# Patient Record
Sex: Male | Born: 1950 | Race: White | Hispanic: No | Marital: Single | Smoking: Current every day smoker
Health system: Southern US, Community
[De-identification: ages and names within clinical notes are randomized; demographics above are authoritative.]

## PROBLEM LIST (undated history)

## (undated) DIAGNOSIS — F319 Bipolar disorder, unspecified: Secondary | ICD-10-CM

## (undated) DIAGNOSIS — M810 Age-related osteoporosis without current pathological fracture: Secondary | ICD-10-CM

## (undated) DIAGNOSIS — E785 Hyperlipidemia, unspecified: Secondary | ICD-10-CM

## (undated) DIAGNOSIS — E871 Hypo-osmolality and hyponatremia: Secondary | ICD-10-CM

## (undated) DIAGNOSIS — F259 Schizoaffective disorder, unspecified: Secondary | ICD-10-CM

## (undated) DIAGNOSIS — J449 Chronic obstructive pulmonary disease, unspecified: Secondary | ICD-10-CM

## (undated) DIAGNOSIS — F79 Unspecified intellectual disabilities: Secondary | ICD-10-CM

---

## 2008-12-01 ENCOUNTER — Ambulatory Visit: Payer: Self-pay | Admitting: Family Medicine

## 2011-02-05 ENCOUNTER — Emergency Department: Payer: Self-pay | Admitting: Emergency Medicine

## 2011-08-05 ENCOUNTER — Emergency Department: Payer: Self-pay | Admitting: *Deleted

## 2016-01-25 ENCOUNTER — Encounter: Payer: Self-pay | Admitting: Emergency Medicine

## 2016-01-25 ENCOUNTER — Emergency Department
Admission: EM | Admit: 2016-01-25 | Discharge: 2016-01-25 | Disposition: A | Discharge status: Home / Self Care | Payer: Medicare PPO | Attending: Emergency Medicine | Admitting: Emergency Medicine

## 2016-01-25 DIAGNOSIS — F458 Other somatoform disorders: Secondary | ICD-10-CM

## 2016-01-25 DIAGNOSIS — F17210 Nicotine dependence, cigarettes, uncomplicated: Secondary | ICD-10-CM | POA: Insufficient documentation

## 2016-01-25 DIAGNOSIS — R531 Weakness: Secondary | ICD-10-CM | POA: Diagnosis present

## 2016-01-25 DIAGNOSIS — G4763 Sleep related bruxism: Secondary | ICD-10-CM | POA: Insufficient documentation

## 2016-01-25 NOTE — ED Provider Notes (Signed)
[facility] Emergency Department Provider Note   ____________________________________________  Time seen: Approximately 10:06 AM  I have reviewed the triage vital signs and the nursing notes.   HISTORY  Chief Complaint Jaw weakness-pt states he thinks he grinds his teeth at night.      HPI Omar Patterson is a 65 y.o. male patient complain of weakness bilateral mandible area. Patient denies any pain with this complaint. Patient denies any pain with this complaint. Patient's stay he has no problem eating and drinking. Onset of complaint was yesterday. It persisted today. No palliative measures for this complaint. Patient state he believes he compresses teeth at night. Patient also has long-term history of devitalized dentures. Patient currently smokes.  History reviewed. No pertinent past medical history.  There are no active problems to display for this patient.   History reviewed. No pertinent past surgical history.  No current outpatient prescriptions on file.  Allergies Review of patient's allergies indicates no known allergies.  No family history on file.  Social History Social History  Substance Use Topics  . Smoking status: Current Every Day Smoker -- 0.50 packs/day    Types: Cigarettes  . Smokeless tobacco: None  . Alcohol Use: No    Review of Systems Constitutional: No fever/chills Eyes: No visual changes. ENT: No sore throat.Weakness bilateral mandible area. Cardiovascular: Denies chest pain. Respiratory: Denies shortness of breath. Gastrointestinal: No abdominal pain.  No nausea, no vomiting.  No diarrhea.  No constipation. Genitourinary: Negative for dysuria. Musculoskeletal: Negative for back pain. Skin: Negative for rash. Neurological: Negative for headaches, focal weakness or numbness.   ____________________________________________   PHYSICAL EXAM:  VITAL SIGNS: ED Triage Vitals  Enc Vitals Group     BP 01/25/16 0938  121/66 mmHg     Pulse Rate 01/25/16 0938 78     Resp 01/25/16 0934 18     Temp 01/25/16 0938 97.7 F (36.5 C)     Temp Source 01/25/16 0938 Oral     SpO2 01/25/16 0938 97 %     Weight 01/25/16 0934 143 lb (64.864 kg)     Height 01/25/16 0934  (1.676 m)     Head Cir --      Peak Flow --      Pain Score --      Pain Loc --      Pain Edu? --      Excl. in GC? --     Constitutional: Alert and oriented. Well appearing and in no acute distress. Eyes: Conjunctivae are normal. PERRL. EOMI. Head: Atraumatic. Nose: No congestion/rhinnorhea. Mouth/Throat: Mucous membranes are moist.  Oropharynx non-erythematous.Multiple devitalized teeth. Gingiva is not edematous or erythematous. Negative guarding palpation TMJ bilaterally. Full nuchal range of motion of the TMJ bilaterally Neck: No stridor.  No cervical spine tenderness to palpation. Hematological/Lymphatic/Immunilogical: No cervical lymphadenopathy. Cardiovascular: Normal rate, regular rhythm. Grossly normal heart sounds.  Good peripheral circulation. Respiratory: Normal respiratory effort.  No retractions. Lungs CTAB. Gastrointestinal: Soft and nontender. No distention. No abdominal bruits. No CVA tenderness. Musculoskeletal: No lower extremity tenderness nor edema.  No joint effusions. Neurologic:  Normal speech and language. No gross focal neurologic deficits are appreciated. No gait instability. Skin:  Skin is warm, dry and intact. No rash noted. Psychiatric: Mood and affect are normal. Speech and behavior are normal.  ____________________________________________   LABS (all labs ordered are listed, but only abnormal results are displayed)  Labs Reviewed - No data to display ____________________________________________  EKG  ____________________________________________  RADIOLOGY   ____________________________________________   PROCEDURES  Procedure(s) performed: None  Critical Care performed:  No  ____________________________________________   INITIAL IMPRESSION / ASSESSMENT AND PLAN / ED COURSE  Pertinent labs & imaging results that were available during my care of the patient were reviewed by me and considered in my medical decision making (see chart for details).  Bruxism. Patient given discharge Instructions. Patient advised follow-up for list of dental clinics provided. ____________________________________________   FINAL CLINICAL IMPRESSION(S) / ED DIAGNOSES  Final diagnoses:  Bruxism      NEW MEDICATIONS STARTED DURING THIS VISIT:  New Prescriptions   No medications on file     Note:  This document was prepared using Dragon voice recognition software and may include unintentional dictation errors.    Joni ReiningRonald K Lenzy Kerschner, PA-C 01/25/16 1023  Richardean Canalavid H Yao, MD 01/31/16 2127

## 2016-01-25 NOTE — Discharge Instructions (Signed)
Advised over-the-counter dental guards until evaluation by dentist. Choose from dental clinic from this provided: OPTIONS FOR DENTAL FOLLOW UP CARE  [place] [facility] Health and Human Services - Local Safety Net Dental Clinics [email or web address]   [facility] [phone])  Sharl Ma [phone])  [place] [phone])  [facility] [phone])  [facility] [phone]) This clinic caters to the indigent population and is on a lottery system. Location: [employer] [facility], [employer], [address] [place] Clinic Hours: Wednesdays from 6pm - 9pm, patients seen by a lottery system. For dates, call or go to [email or web address] Services: Cleanings, fillings and simple extractions. Payment Options: DENTAL WORK IS FREE OF CHARGE. Bring proof of income or support. Best way to get seen: Arrive at 5:15 pm - this is a lottery, NOT first come/first serve, so arriving earlier will not increase your chances of being seen.     [facility] [phone] Select option 1 for emergencies   Location: [facility], [place], [address] [place] Clinic Hours: No walk-ins accepted - call the day before to schedule an appointment. Check in times are 9:30 am and 1:30 pm. Services: Simple extractions, temporary fillings, pulpectomy/pulp debridement, uncomplicated abscess drainage. Payment Options: PAYMENT IS DUE AT THE TIME OF SERVICE.  Fee is usually $100-200, additional surgical procedures (e.g. abscess drainage) may be extra. Cash, checks, Visa/MasterCard accepted.  Can file Medicaid if patient is covered for dental - patient should call case worker to check. No discount for [facility] patients. Best way to get seen: MUST call the day before and get onto the schedule. Can  usually be seen the next 1-2 days. No walk-ins accepted.     [facility] Dental Services [phone]   Location: [facility], [address] [place] Clinic Hours: M, W, Th, F 8am or 1:30pm, Tues 9a or 1:30 - first come/first served. Services: Simple extractions, temporary fillings, uncomplicated abscess drainage.  You do not need to be an [facility] resident. Payment Options: PAYMENT IS DUE AT THE TIME OF SERVICE. Dental insurance, otherwise sliding scale - bring proof of income or support. Depending on income and treatment needed, cost is usually $50-200. Best way to get seen: Arrive early as it is first come/first served.     [facility] [phone]   Location: [address] Clinic Hours: Mon-Thu 8a-5p Services: Most basic dental services including extractions and fillings. Payment Options: PAYMENT IS DUE AT THE TIME OF SERVICE. Sliding scale, up to 50% off - bring proof if income or support. Medicaid with dental option accepted. Best way to get seen: Call to schedule an appointment, can usually be seen within 2 weeks OR they will try to see walk-ins - show up at 8a or 2p (you may have to wait).     [facility] [phone] [place] RESIDENTS ONLY   Location: [facility], [address] [place], [place] [address] Clinic Hours: By appointment only. Monday - Thursday 8am-5pm, Friday 8am-12pm Services: Cleanings, fillings, extractions. Payment Options: PAYMENT IS DUE AT THE TIME OF SERVICE. Cash, Visa or MasterCard. Sliding scale - $30 minimum per service. Best way to get seen: Come in to office, complete packet and make an appointment - need proof of income or support monies for each household member and proof of [facility] residence. Usually takes about a month to get in.  [facility] [phone]   Location: [address] [facility] Hours: Walk-in Urgent Care Dental Services are offered Monday-Friday mornings only. The numbers of emergencies accepted daily is limited to the number of providers available. Maximum 15 - Mondays, Wednesdays & Thursdays Maximum 10 - Tuesdays & Fridays Services: You do not need to be a [facility] resident to be seen for a dental emergency. Emergencies are defined as pain, swelling, abnormal bleeding, or dental trauma. Walkins will receive x-rays if needed. NOTE: Dental cleaning is not an emergency. Payment Options: PAYMENT IS DUE AT THE TIME OF SERVICE. Minimum co-pay is $40.00 for uninsured patients. Minimum co-pay is $3.00 for Medicaid with dental coverage. Dental Insurance is accepted and must be presented at time of visit. Medicare does not cover dental. Forms of payment: Cash, credit card, checks. Best way to get seen: If not previously registered with the clinic, walk-in dental registration begins at 7:15 am and is on a first come/first serve basis. If previously registered with the clinic, call to make an appointment.     [facility] [phone] [place] RESIDENTS ONLY   Location: [address], [place], [place] Clinic Hours: Mon-Thu 10a-2p Services: Extractions only! Payment Options: FREE (donations accepted) - bring proof of income or support Best way to get seen: Call and schedule an appointment OR come at 8am on the 1st Monday of every month (except for holidays) when it is first come/first served.     [facility] [phone]   Location: [address], [place] Clinic Hours: Friday mornings Services, Payment Options, Best way to get seen: Call for info

## 2016-01-25 NOTE — ED Notes (Signed)
Patient presents to the ED stating, "I feel weak in my jaw." Patient states symptom began yesterday.  Patient denies any difficulty speaking and denies any pain, denies difficulty eating as well.  Patient is in no obvious distress at this time.

## 2016-01-25 NOTE — ED Notes (Signed)
Pt states yesterday his jaw started feeling weak bilaterally, denies pain, states he has no problem chewing, just has weakness in his jaw area.

## 2016-03-04 ENCOUNTER — Emergency Department
Admission: EM | Admit: 2016-03-04 | Discharge: 2016-03-04 | Disposition: A | Discharge status: Home / Self Care | Payer: Medicare PPO | Attending: Emergency Medicine | Admitting: Emergency Medicine

## 2016-03-04 DIAGNOSIS — F17210 Nicotine dependence, cigarettes, uncomplicated: Secondary | ICD-10-CM | POA: Insufficient documentation

## 2016-03-04 DIAGNOSIS — Z760 Encounter for issue of repeat prescription: Secondary | ICD-10-CM | POA: Diagnosis present

## 2016-03-04 MED ORDER — CLONAZEPAM 0.5 MG PO TABS: 0.5000 mg | ORAL_TABLET | Freq: Two times a day (BID) | ORAL | Status: DC

## 2016-03-04 NOTE — ED Provider Notes (Signed)
[facility] Emergency Department Provider Note  ____________________________________________  Time seen: Approximately 3:43 PM  I have reviewed the triage vital signs and the nursing notes.   HISTORY  Chief Complaint Medication Refill    HPI Omar Patterson is a 65 y.o. male presents emergency room for refill of his clonazepam. Patient's caretaker from the group home reports he been out for some time now and does not have an appointment to get his medication filled. However, after speaking with the patient's provider the patient does have a doctor's appointment in 2 days at 4 PM. His caretaker states that patient said the patient unable to focus at work and has a difficult time sleeping.   No past medical history on file.  There are no active problems to display for this patient.   No past surgical history on file.  Current Outpatient Rx  Name  Route  Sig  Dispense  Refill  . clonazePAM (KLONOPIN) 0.5 MG tablet   Oral   Take 1 tablet (0.5 mg total) by mouth 2 (two) times daily.   6 tablet   0     Allergies Review of patient's allergies indicates no known allergies.  No family history on file.  Social History Social History  Substance Use Topics  . Smoking status: Current Every Day Smoker -- 0.50 packs/day    Types: Cigarettes  . Smokeless tobacco: Not on file  . Alcohol Use: No    Review of Systems Constitutional: No fever/chills Eyes: No visual changes. ENT: No sore throat. Cardiovascular: Denies chest pain. Respiratory: Denies shortness of breath. Gastrointestinal: No abdominal pain.  No nausea, no vomiting.  No diarrhea.  No constipation. Genitourinary: Negative for dysuria. Musculoskeletal: Negative for back pain. Skin: Negative for rash. Neurological: Negative for headaches, focal weakness or numbness.  10-point ROS otherwise negative.  ____________________________________________   PHYSICAL EXAM:  VITAL SIGNS: ED Triage  Vitals  Enc Vitals Group     BP 03/04/16 1524 148/91 mmHg     Pulse Rate 03/04/16 1524 81     Resp 03/04/16 1524 16     Temp 03/04/16 1524 98.4 F (36.9 C)     Temp Source 03/04/16 1524 Oral     SpO2 03/04/16 1524 98 %     Weight 03/04/16 1524 142 lb (64.411 kg)     Height 03/04/16 1524 5\' 8"  (1.727 m)     Head Cir --      Peak Flow --      Pain Score --      Pain Loc --      Pain Edu? --      Excl. in GC? --     Constitutional: Alert and oriented. Well appearing and in no acute distress. Neck: No stridor.   Cardiovascular: Normal rate, regular rhythm. Grossly normal heart sounds.  Good peripheral circulation. Respiratory: Normal respiratory effort.  No retractions. Lungs CTAB. Musculoskeletal: No lower extremity tenderness nor edema.  No joint effusions. Neurologic:  Normal speech and language. No gross focal neurologic deficits are appreciated. No gait instability. Skin:  Skin is warm, dry and intact. No rash noted. Psychiatric: Mood and affect are normal. Speech and behavior are normal.  ____________________________________________   LABS (all labs ordered are listed, but only abnormal results are displayed)  Labs Reviewed - No data to display ____________________________________________  EKG   ____________________________________________  RADIOLOGY   ____________________________________________   PROCEDURES  Procedure(s) performed: None  Critical Care performed: No  ____________________________________________   INITIAL  IMPRESSION / ASSESSMENT AND PLAN / ED COURSE  Pertinent labs & imaging results that were available during my care of the patient were reviewed by me and considered in my medical decision making (see chart for details).  Medication refill for Klonopin. Rx given for 0.5 mg #6 no refills. Patient voices no other medical complaints at this time. ____________________________________________   FINAL CLINICAL IMPRESSION(S) / ED  DIAGNOSES  Final diagnoses:  Encounter for medication refill     This chart was dictated using voice recognition software/Dragon. Despite best efforts to proofread, errors can occur which can change the meaning. Any change was purely unintentional.   Evangeline Dakin, PA-C 03/04/16 1559  Sharman Cheek, MD 03/05/16 2011

## 2016-03-04 NOTE — Discharge Instructions (Signed)
Medicine Refill at the Emergency Department  We have refilled your medicine today, but it is best for you to get refills through your primary health care provider's office. In the future, please plan ahead so you do not need to get refills from the emergency department.  If the medicine we refilled was a maintenance medicine, you may have received only enough to get you by until you are able to see your regular health care provider.     This information is not intended to replace advice given to you by your health care provider. Make sure you discuss any questions you have with your health care provider.     Document Released: 11/22/2003 Document Revised: 08/26/2014 Document Reviewed: 11/12/2013  Elsevier Interactive Patient Education 2016 Elsevier Inc.

## 2016-03-04 NOTE — ED Notes (Signed)
Pt here for klonopin refill, has appt later this month with PCP. Group home worker reports pt hasn't been able to sleep at night due to not having medication refilled.

## 2016-03-04 NOTE — ED Notes (Signed)
Pt in via triage from [facility]; representative with pt at bedside.  Representative reports pt needing Klonopin refill; she reports that [facility] manages his medication but the last time he was seen by [facility], they refused to refill his klonopin and she is unsure why.

## 2016-03-19 ENCOUNTER — Emergency Department
Admission: EM | Admit: 2016-03-19 | Discharge: 2016-03-19 | Disposition: A | Discharge status: Home / Self Care | Payer: Medicare PPO | Attending: Emergency Medicine | Admitting: Emergency Medicine

## 2016-03-19 ENCOUNTER — Encounter: Payer: Self-pay | Admitting: Emergency Medicine

## 2016-03-19 DIAGNOSIS — Z760 Encounter for issue of repeat prescription: Secondary | ICD-10-CM | POA: Insufficient documentation

## 2016-03-19 DIAGNOSIS — Z5321 Procedure and treatment not carried out due to patient leaving prior to being seen by health care provider: Secondary | ICD-10-CM | POA: Insufficient documentation

## 2016-03-19 NOTE — ED Triage Notes (Signed)
Brought in by group home staff, states pt needs medication refilled.

## 2016-03-20 NOTE — Progress Notes (Signed)
CSW contacted [facility] [phone] to inquire about pt's visits to the ED. Pt got a refill of Klonopin on 7/17 and then came to the ED again for another refill on 8/1. Group home worker states [facility] is probably not filling pt's med because pt's current MAR does not have Klonopin listed. CSW questioned if group home worker could get MAR updated to refelct new changes. She stated that she would speak to her supervisor and call CSW back.  Jonathon Jordan, MSW, Theresia Majors [phone]

## 2016-05-20 ENCOUNTER — Encounter: Payer: Self-pay | Admitting: Emergency Medicine

## 2016-05-20 ENCOUNTER — Emergency Department
Admission: EM | Admit: 2016-05-20 | Discharge: 2016-05-20 | Disposition: A | Discharge status: Home / Self Care | Payer: Medicare PPO | Attending: Emergency Medicine | Admitting: Emergency Medicine

## 2016-05-20 DIAGNOSIS — H6123 Impacted cerumen, bilateral: Secondary | ICD-10-CM | POA: Diagnosis not present

## 2016-05-20 DIAGNOSIS — F17210 Nicotine dependence, cigarettes, uncomplicated: Secondary | ICD-10-CM | POA: Diagnosis not present

## 2016-05-20 DIAGNOSIS — R51 Headache: Secondary | ICD-10-CM | POA: Diagnosis present

## 2016-05-20 MED ORDER — DOCUSATE SODIUM 50 MG/5ML PO LIQD
50.0000 mg | Freq: Once | ORAL | Status: AC
  Administered 2016-05-20: 50 mg via ORAL
  Filled 2016-05-20: qty 10

## 2016-05-20 NOTE — ED Provider Notes (Signed)
[facility] Emergency Department Provider Note  ____________________________________________  Time seen: Approximately 5:08 PM  I have reviewed the triage vital signs and the nursing notes.   HISTORY  Chief Complaint Facial Pain    HPI Omar CanterGrady A Patterson is a 65 y.o. male , NAD, presents to the emergency department accompanied by his caregiver who assists with history.He states he has had some pressure in his ears over the last couple of days. His caregiver states he has been asking other caregivers in his group home to clean out his ears. States that he was given aspirin by his manager at work for the pressure he's been feeling and asked that he be seen to be cleared to return to work. Patient denies any nasal congestion, runny nose, sore throat, cough, chest congestion. No fevers, chills, body aches. Has had no abdominal pain, nausea or vomiting. No visual changes, lightheadedness or dizziness.   History reviewed. No pertinent past medical history.  There are no active problems to display for this patient.   History reviewed. No pertinent surgical history.  Prior to Admission medications   Medication Sig Start Date End Date Taking? Authorizing Provider  clonazePAM (KLONOPIN) 0.5 MG tablet Take 1 tablet (0.5 mg total) by mouth 2 (two) times daily. 03/04/16 03/04/17  Evangeline Dakinharles M Beers, PA-C    Allergies Review of patient's allergies indicates no known allergies.  No family history on file.  Social History Social History  Substance Use Topics  . Smoking status: Current Every Day Smoker    Packs/day: 0.25    Types: Cigarettes  . Smokeless tobacco: Former NeurosurgeonUser  . Alcohol use No     Review of Systems  Constitutional: No fever/chills ENT: Positive bilateral ear pressure without drainage. No sore throat, nasal congestion, runny nose, sinus pressure. Cardiovascular: No chest pain. Respiratory: No cough or chest congestion. No shortness of breath. No wheezing.   Gastrointestinal: No abdominal pain.  No nausea, vomiting. Musculoskeletal: Negative for general myalgias.  Skin: Negative for rash, skin sores. Neurological: Negative for dizziness, lightheadedness. 10-point ROS otherwise negative.  ____________________________________________   PHYSICAL EXAM:  VITAL SIGNS: ED Triage Vitals [05/20/16 1628]  Enc Vitals Group     BP 133/86     Pulse Rate 83     Resp 18     Temp 98.6 F (37 C)     Temp Source Oral     SpO2 99 %     Weight 148 lb (67.1 kg)     Height 5\' 8"  (1.727 m)     Head Circumference      Peak Flow      Pain Score      Pain Loc      Pain Edu?      Excl. in GC?      Constitutional: Alert and oriented. Well appearing and in no acute distress. Eyes: Conjunctivae are normal without icterus or injection Head: Atraumatic. ENT:      Ears: Bilateral TMs cannot be visualized due to severe cerumen noted in bilateral canals.      Nose: No congestion/rhinnorhea.      Mouth/Throat: Mucous membranes are moist. Pharynx without erythema, swelling, exudate. Airways patent. Uvula is midline. Neck: Supple with full range of motion Hematological/Lymphatic/Immunilogical: No cervical lymphadenopathy. Cardiovascular: Normal rate, regular rhythm. Normal S1 and S2.  Good peripheral circulation. Respiratory: Normal respiratory effort without tachypnea or retractions. Lungs CTAB with breath sounds noted in all lung fields. Neurologic:  Normal speech and language. No gross  focal neurologic deficits are appreciated.  Skin:  Skin is warm, dry and intact. No rash noted. Psychiatric: Mood and affect are normal. Speech and behavior are normal.    ____________________________________________   LABS  None ____________________________________________  EKG  None ____________________________________________  RADIOLOGY  None ____________________________________________    PROCEDURES  Procedure(s) performed: None   .Ear Cerumen  Removal Date/Time: 05/20/2016 6:27 PM Performed by: Tye Savoy L Authorized by: Hope Pigeon   Consent:    Consent obtained:  Verbal   Consent given by:  Patient   Risks discussed:  Bleeding, incomplete removal, pain and TM perforation   Alternatives discussed:  No treatment and delayed treatment Procedure details:    Location:  L ear and R ear   Procedure type: irrigation     Procedure type comment:  And curette Post-procedure details:    Post-procedure ear inspection: All cerumen could not be removed.    Hearing quality:  Improved   Patient tolerance of procedure:  Tolerated well, no immediate complications Comments:     Docusate sodium applied to bilateral canals x 10 minutes.  Right ear canal was completely cleared with irrigation and use of the curet. Right TM was visualized without erythema, bulging, perforation. Left ear canal continues to have minimal soft cerumen that is covering the TM. Multiple attempts with irrigation was completed without successful removal of all the wax. Patient did experience some pain with the last irrigation attempts therefore the procedure was terminated.      Medications  docusate (COLACE) 50 MG/5ML liquid 50 mg (50 mg Oral Given 05/20/16 1809)     ____________________________________________   INITIAL IMPRESSION / ASSESSMENT AND PLAN / ED COURSE  Pertinent labs & imaging results that were available during my care of the patient were reviewed by me and considered in my medical decision making (see chart for details).  Clinical Course    Patient's diagnosis is consistent with Bilateral impacted cerumen. Remaining cerumen in the left ear canal should fall out over the next few days as it is soft. Patient will be discharged home with instructions to follow up with his primary care provider or [facility] if symptoms persist or if further irrigation is needed.  Patient is given ED precautions to return to the ED for any worsening or  new symptoms.    ____________________________________________  FINAL CLINICAL IMPRESSION(S) / ED DIAGNOSES  Final diagnoses:  Bilateral impacted cerumen      NEW MEDICATIONS STARTED DURING THIS VISIT:  New Prescriptions   No medications on file         Hope Pigeon, PA-C 05/20/16 1855    Governor Rooks, MD 05/20/16 2043

## 2016-05-20 NOTE — ED Triage Notes (Signed)
Sinus and ear congestion x 2 days.

## 2016-05-31 ENCOUNTER — Emergency Department
Admission: EM | Admit: 2016-05-31 | Discharge: 2016-05-31 | Disposition: A | Discharge status: Home / Self Care | Payer: Medicare PPO | Attending: Emergency Medicine | Admitting: Emergency Medicine

## 2016-05-31 ENCOUNTER — Encounter: Payer: Self-pay | Admitting: Emergency Medicine

## 2016-05-31 ENCOUNTER — Emergency Department: Payer: Medicare PPO

## 2016-05-31 DIAGNOSIS — Y929 Unspecified place or not applicable: Secondary | ICD-10-CM | POA: Diagnosis not present

## 2016-05-31 DIAGNOSIS — Y999 Unspecified external cause status: Secondary | ICD-10-CM | POA: Insufficient documentation

## 2016-05-31 DIAGNOSIS — S0993XA Unspecified injury of face, initial encounter: Secondary | ICD-10-CM | POA: Diagnosis present

## 2016-05-31 DIAGNOSIS — F17210 Nicotine dependence, cigarettes, uncomplicated: Secondary | ICD-10-CM | POA: Diagnosis not present

## 2016-05-31 DIAGNOSIS — S0083XA Contusion of other part of head, initial encounter: Secondary | ICD-10-CM | POA: Diagnosis not present

## 2016-05-31 DIAGNOSIS — Y939 Activity, unspecified: Secondary | ICD-10-CM | POA: Diagnosis not present

## 2016-05-31 MED ORDER — NAPROXEN 500 MG PO TABS: 500.0000 mg | ORAL_TABLET | Freq: Two times a day (BID) | ORAL | 0 refills | Status: DC
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
## 2017-02-23 ENCOUNTER — Encounter: Payer: Self-pay | Admitting: Emergency Medicine

## 2017-02-23 ENCOUNTER — Emergency Department
Admission: EM | Admit: 2017-02-23 | Discharge: 2017-02-23 | Discharge status: Against Medical Advice | Payer: Medicare PPO | Attending: Emergency Medicine | Admitting: Emergency Medicine

## 2017-02-23 DIAGNOSIS — R251 Tremor, unspecified: Secondary | ICD-10-CM | POA: Diagnosis present

## 2017-02-23 DIAGNOSIS — Z5321 Procedure and treatment not carried out due to patient leaving prior to being seen by health care provider: Secondary | ICD-10-CM | POA: Insufficient documentation

## 2017-02-23 HISTORY — DX: Bipolar disorder, unspecified: F31.9

## 2017-02-23 HISTORY — DX: Chronic obstructive pulmonary disease, unspecified: J44.9

## 2017-02-23 HISTORY — DX: Age-related osteoporosis without current pathological fracture: M81.0

## 2017-02-23 HISTORY — DX: Schizoaffective disorder, unspecified: F25.9

## 2017-02-23 HISTORY — DX: Hypo-osmolality and hyponatremia: E87.1

## 2017-02-23 HISTORY — DX: Unspecified intellectual disabilities: F79

## 2017-02-23 HISTORY — DX: Hyperlipidemia, unspecified: E78.5

## 2017-02-23 NOTE — ED Triage Notes (Signed)
Patient presents to the ED after an episode of "shaking" x 1 hour.  Patient states he has never had an episode like that before and he just wanted to find out why.  [facility] and his brother is his legal guardian.  This RN attempted to call patient's brother but the call went straight to voicemail.  Patient denies any pain and states he was not in pain with the shaking.

## 2017-03-23 ENCOUNTER — Encounter: Payer: Self-pay | Admitting: Emergency Medicine

## 2017-03-23 ENCOUNTER — Emergency Department
Admission: EM | Admit: 2017-03-23 | Discharge: 2017-03-23 | Disposition: A | Discharge status: Home / Self Care | Payer: Medicare PPO | Attending: Emergency Medicine | Admitting: Emergency Medicine

## 2017-03-23 DIAGNOSIS — T383X1A Poisoning by insulin and oral hypoglycemic [antidiabetic] drugs, accidental (unintentional), initial encounter: Secondary | ICD-10-CM | POA: Diagnosis not present

## 2017-03-23 DIAGNOSIS — T452X1A Poisoning by vitamins, accidental (unintentional), initial encounter: Secondary | ICD-10-CM | POA: Insufficient documentation

## 2017-03-23 DIAGNOSIS — K029 Dental caries, unspecified: Secondary | ICD-10-CM | POA: Diagnosis not present

## 2017-03-23 DIAGNOSIS — T50901A Poisoning by unspecified drugs, medicaments and biological substances, accidental (unintentional), initial encounter: Secondary | ICD-10-CM

## 2017-03-23 DIAGNOSIS — F319 Bipolar disorder, unspecified: Secondary | ICD-10-CM | POA: Diagnosis not present

## 2017-03-23 DIAGNOSIS — J449 Chronic obstructive pulmonary disease, unspecified: Secondary | ICD-10-CM | POA: Diagnosis not present

## 2017-03-23 DIAGNOSIS — K047 Periapical abscess without sinus: Secondary | ICD-10-CM | POA: Insufficient documentation

## 2017-03-23 DIAGNOSIS — Z79899 Other long term (current) drug therapy: Secondary | ICD-10-CM | POA: Insufficient documentation

## 2017-03-23 DIAGNOSIS — Y92199 Unspecified place in other specified residential institution as the place of occurrence of the external cause: Secondary | ICD-10-CM | POA: Insufficient documentation

## 2017-03-23 DIAGNOSIS — F17210 Nicotine dependence, cigarettes, uncomplicated: Secondary | ICD-10-CM | POA: Diagnosis not present

## 2017-03-23 DIAGNOSIS — T502X1A Poisoning by carbonic-anhydrase inhibitors, benzothiadiazides and other diuretics, accidental (unintentional), initial encounter: Secondary | ICD-10-CM | POA: Diagnosis present

## 2017-03-23 LAB — CBC WITH DIFFERENTIAL/PLATELET
Basophils Absolute: 0.1 10*3/uL (ref 0–0.1)
Basophils Relative: 1 %
EOS ABS: 0.3 10*3/uL (ref 0–0.7)
Eosinophils Relative: 2 %
HCT: 42.2 % (ref 40.0–52.0)
HEMOGLOBIN: 14.3 g/dL (ref 13.0–18.0)
LYMPHS ABS: 1.9 10*3/uL (ref 1.0–3.6)
LYMPHS PCT: 14 %
MCH: 31.9 pg (ref 26.0–34.0)
MCHC: 33.9 g/dL (ref 32.0–36.0)
MCV: 94.2 fL (ref 80.0–100.0)
Monocytes Absolute: 1.4 10*3/uL — ABNORMAL HIGH (ref 0.2–1.0)
Monocytes Relative: 11 %
Neutro Abs: 9.8 10*3/uL — ABNORMAL HIGH (ref 1.4–6.5)
Neutrophils Relative %: 72 %
Platelets: 238 10*3/uL (ref 150–440)
RBC: 4.48 MIL/uL (ref 4.40–5.90)
RDW: 14.4 % (ref 11.5–14.5)
WBC: 13.6 10*3/uL — AB (ref 3.8–10.6)

## 2017-03-23 LAB — BASIC METABOLIC PANEL
ANION GAP: 7 (ref 5–15)
BUN: 9 mg/dL (ref 6–20)
CALCIUM: 9.5 mg/dL (ref 8.9–10.3)
CO2: 24 mmol/L (ref 22–32)
Chloride: 106 mmol/L (ref 101–111)
Creatinine, Ser: 0.9 mg/dL (ref 0.61–1.24)
GFR calc Af Amer: >60 mL/min (ref >60)
GLUCOSE: 99 mg/dL (ref 65–99)
POTASSIUM: 4.4 mmol/L (ref 3.5–5.1)
SODIUM: 137 mmol/L (ref 135–145)

## 2017-03-23 LAB — GLUCOSE, CAPILLARY
GLUCOSE-CAPILLARY: 64 mg/dL — AB (ref 65–99)
Glucose-Capillary: 88 mg/dL (ref 65–99)

## 2017-03-23 MED ORDER — PENICILLIN V POTASSIUM 500 MG PO TABS: 500.0000 mg | ORAL_TABLET | Freq: Four times a day (QID) | ORAL | 0 refills | Status: AC

## 2017-03-23 NOTE — ED Notes (Addendum)
FIRST NURSE NOTE:  [facility] [phone], was given the wrong medications:  Triamterene/HCTZ 75/50 Vitamin D 5000 IU Metoprolol 50mg  Metformin 1500mg   Pt did not take his own medications today.  Pt has Legal Jerrye BushyGuardian-brother Randy.

## 2017-03-23 NOTE — ED Provider Notes (Signed)
-----------------------------------------   4:19 PM on 03/23/2017 -----------------------------------------  Asked to evaluate patients mouth because of concern for dental abscess. Patient has poor dentition with multiple caries. Was tender to palpation along the left lower jaw. Will plan on starting patient on penicillin for dental infection. Patient has dental appointment already scheduled for next week.   Phineas SemenGoodman, Nickey Kloepfer, MD 03/23/17 [phone]

## 2017-03-23 NOTE — ED Notes (Signed)
Pt given lunch tray and gingerale

## 2017-03-23 NOTE — ED Notes (Addendum)
Spoke with poison control - hold for these meds (triamterene/hctz 75/50 Metoprolol 200 mg xl Metformin 1500mg  reg strength  Hold is 8 hrs from time of ingestion which was taken at 8am. That time is now up.

## 2017-03-23 NOTE — ED Triage Notes (Signed)
Patient presents to the ED from [facility].  Patient was given another resident's medications this morning, including metoprolol, metformin and vitamin D.  Patient denies any pain or dizziness and is in no obvious distress at this time.

## 2017-03-23 NOTE — Discharge Instructions (Signed)
Return to the ER immediately for any worsening condition including confusion altered mental status, dizziness or passing out, low blood pressure, or any other symptoms concerning to you.

## 2017-03-23 NOTE — ED Notes (Signed)
bs 88 - ride here for p/u. States he was here for abscess too and has appt on Friday.

## 2017-03-23 NOTE — ED Notes (Signed)
Pt given coffee. assympomatic at this time.

## 2017-03-23 NOTE — ED Provider Notes (Addendum)
[facility] Emergency Department Provider Note ____________________________________________   I have reviewed the triage vital signs and the triage nursing note.  HISTORY  Chief Complaint Other (Medication Error)   Historian Patient, and group  Home representative  HPI Omar Patterson is a 66 y.o. male with bipolar, and schizoaffective disorder and intellectual disability, stays in a group home, was given another resident's medications this morning including:  Triamterene/HCTZ 75/50 Vitamin D 5000 IU Metformin 1500mg   This was just prior to arrival, around 8 AM.  No symptoms observed. Patient is not on blood pressure medication himself.    Past Medical History:  Diagnosis Date  . Bipolar 1 disorder (HCC)   . COPD (chronic obstructive pulmonary disease) (HCC)   . Hyperlipidemia   . Hyponatremia   . Intellectual disability   . Osteoporosis   . Schizoaffective disorder (HCC)     There are no active problems to display for this patient.   History reviewed. No pertinent surgical history.  Prior to Admission medications   Medication Sig Start Date End Date Taking? Authorizing Provider  clonazePAM (KLONOPIN) 0.5 MG tablet Take 1 tablet (0.5 mg total) by mouth 2 (two) times daily. 03/04/16 03/04/17  Beers, Charmayne Sheerharles M, PA-C  naproxen (NAPROSYN) 500 MG tablet Take 1 tablet (500 mg total) by mouth 2 (two) times daily with a meal. 05/31/16   Hagler, Jami L, PA-C    No Known Allergies  No family history on file.  Social History Social History  Substance Use Topics  . Smoking status: Current Every Day Smoker    Packs/day: 0.50    Types: Cigarettes  . Smokeless tobacco: Former NeurosurgeonUser  . Alcohol use No    Review of Systems  Constitutional: Negative for fever. Eyes: Negative for visual changes. ENT: Negative for sore throat. Cardiovascular: Negative for chest pain. Respiratory: Negative for shortness of breath. Gastrointestinal: Negative for  abdominal pain, vomiting and diarrhea. Genitourinary: Negative for dysuria. Musculoskeletal: Negative for back pain. Skin: Negative for rash. Neurological: Negative for headache.  ____________________________________________   PHYSICAL EXAM:  VITAL SIGNS: ED Triage Vitals  Enc Vitals Group     BP 03/23/17 0852 129/76     Pulse Rate 03/23/17 0852 79     Resp 03/23/17 0852 18     Temp 03/23/17 0852 98 F (36.7 C)     Temp Source 03/23/17 0852 Oral     SpO2 03/23/17 0852 100 %     Weight 03/23/17 0853 145 lb (65.8 kg)     Height 03/23/17 0853 5\' 8"  (1.727 m)     Head Circumference --      Peak Flow --      Pain Score 03/23/17 0852 0     Pain Loc --      Pain Edu? --      Excl. in GC? --      Constitutional: Alert and Cooperative. Well appearing and in no distress. HEENT   Head: Normocephalic and atraumatic.      Eyes: Conjunctivae are normal. Pupils equal and round.       Ears:         Nose: No congestion/rhinnorhea.   Mouth/Throat: Mucous membranes are moist.   Neck: No stridor. Cardiovascular/Chest: Normal rate, regular rhythm.  No murmurs, rubs, or gallops. Respiratory: Normal respiratory effort without tachypnea nor retractions. Breath sounds are clear and equal bilaterally. No wheezes/rales/rhonchi. Gastrointestinal: Soft. No distention, no guarding, no rebound. Nontender.    Genitourinary/rectal:Deferred Musculoskeletal: Nontender with normal range  of motion in all extremities. No joint effusions.  No lower extremity tenderness.  No edema. Neurologic:  Normal speech and language. No gross or focal neurologic deficits are appreciated. Skin:  Skin is warm, dry and intact. No rash noted. Psychiatric: Mood and affect are normal. Speech and behavior are normal. Patient exhibits appropriate insight and judgment.   ____________________________________________  LABS (pertinent positives/negatives)  Labs Reviewed  CBC WITH DIFFERENTIAL/PLATELET - Abnormal;  Notable for the following:       Result Value   WBC 13.6 (*)    Neutro Abs 9.8 (*)    Monocytes Absolute 1.4 (*)    All other components within normal limits  BASIC METABOLIC PANEL    ____________________________________________    EKG I, Governor Rooksebecca Cande Mastropietro, MD, the attending physician have personally viewed and interpreted all ECGs.  None ____________________________________________  RADIOLOGY All Xrays were viewed by me. Imaging interpreted by Radiologist.  None __________________________________________  PROCEDURES  Procedure(s) performed: None  Critical Care performed: None  ____________________________________________   ED COURSE / ASSESSMENT AND PLAN  Pertinent labs & imaging results that were available during my care of the patient were reviewed by me and considered in my medical decision making (see chart for details).   Omar Patterson was given medications which were not his including blood pressure medications as well as a diabetic medication.  I reviewed the onset, peak, and duration of treatment of triamterine, hctz, metoprolol xl and immediate release (initially unsure of what type), and metformin. Patient did have some mild hypotension here with blood pressure in the 90s, but rechecked around 118 systolic at about 5 or so hours post ingestion. He has no symptoms when he was having blood pressure in the mid 90s systolic.  Nurse to Confirm whether metoprolol was long-acting or not and to speak with poison center with regard to how long to observe for bp and glucose effects.  I have prepared discharge instructions once patient is cleared after discussion with poison center.  Symptoms and discussed the medications and observation. For approximately 8 hours which it has been since ingestion at this point. Patient will be discharged.    CONSULTATIONS: Nurse discussed with poison center.   Patient / Family / Caregiver informed of clinical course, medical  decision-making process, and agree with plan.   I discussed return precautions, follow-up instructions, and discharge instructions with patient and/or family.  Discharge Instructions : Return to the ER immediately for any worsening condition including confusion altered mental status, dizziness or passing out, low blood pressure, or any other symptoms concerning to you.  ___________________________________________   FINAL CLINICAL IMPRESSION(S) / ED DIAGNOSES   Final diagnoses:  Accidental drug ingestion, initial encounter              Note: This dictation was prepared with Dragon dictation. Any transcriptional errors that result from this process are unintentional    Governor RooksLord, Julis Haubner, MD 03/23/17 1433    Governor RooksLord, Teige Rountree, MD 03/23/17 1539

## 2017-03-23 NOTE — ED Notes (Signed)
bs 66 - given orange juice, cola, graham crackers and peanut butter

## 2017-03-23 NOTE — ED Notes (Signed)
Poison control called [phone]

## 2017-03-26 ENCOUNTER — Encounter: Payer: Self-pay | Admitting: Emergency Medicine

## 2017-03-26 ENCOUNTER — Emergency Department
Admission: EM | Admit: 2017-03-26 | Discharge: 2017-03-26 | Disposition: A | Discharge status: Home / Self Care | Payer: Medicare PPO | Attending: Emergency Medicine | Admitting: Emergency Medicine

## 2017-03-26 DIAGNOSIS — T63421A Toxic effect of venom of ants, accidental (unintentional), initial encounter: Secondary | ICD-10-CM | POA: Diagnosis not present

## 2017-03-26 DIAGNOSIS — R21 Rash and other nonspecific skin eruption: Secondary | ICD-10-CM

## 2017-03-26 DIAGNOSIS — J449 Chronic obstructive pulmonary disease, unspecified: Secondary | ICD-10-CM | POA: Diagnosis not present

## 2017-03-26 DIAGNOSIS — F17210 Nicotine dependence, cigarettes, uncomplicated: Secondary | ICD-10-CM | POA: Diagnosis not present

## 2017-03-26 DIAGNOSIS — Z79899 Other long term (current) drug therapy: Secondary | ICD-10-CM | POA: Insufficient documentation

## 2017-03-26 MED ORDER — SULFAMETHOXAZOLE-TRIMETHOPRIM 800-160 MG PO TABS: 1.0000 | ORAL_TABLET | Freq: Two times a day (BID) | ORAL | 0 refills | Status: DC

## 2017-03-26 NOTE — Discharge Instructions (Signed)
Take Benadryl as needed for itching as directed on the medication bottle.     Wounds on the right forearm are not a reaction to the penicillin medication. However, we will adjust antibiotic coverage to Bactrim for staph infection coverage as well as coverage for dental abscess.  If you notice bite wounds demonstrate signs of developing infection do not hesitate to return to emergency department.

## 2017-03-26 NOTE — ED Notes (Signed)

## 2017-03-26 NOTE — ED Provider Notes (Signed)
[facility] Emergency Department Provider Note   ____________________________________________   I have reviewed the triage vital signs and the nursing notes.   HISTORY  Chief Complaint Rash    HPI Omar Patterson is a 66 y.o. male presents to the emergency department with aunt bite wounds along the right forearm. Patient lives in a group home and his caregiver brought him in to be evaluated because the wounds occurred at the same time he began taking penicillin. The facility was unsure if he was having an allergic reaction to the penicillin. Patient denies the wounds itching or swelling. Patient denies any past history of allergy to insect bites. Patient also denies any past allergies to medications. Patient denies fever, chills, headache, vision changes, chest pain, chest tightness, shortness of breath, abdominal pain, nausea and vomiting.  Past Medical History:  Diagnosis Date  . Bipolar 1 disorder (HCC)   . COPD (chronic obstructive pulmonary disease) (HCC)   . Hyperlipidemia   . Hyponatremia   . Intellectual disability   . Osteoporosis   . Schizoaffective disorder (HCC)     There are no active problems to display for this patient.   History reviewed. No pertinent surgical history.  Prior to Admission medications   Medication Sig Start Date End Date Taking? Authorizing Provider  clonazePAM (KLONOPIN) 0.5 MG tablet Take 1 tablet (0.5 mg total) by mouth 2 (two) times daily. 03/04/16 03/04/17  Beers, Charmayne Sheer, PA-C  naproxen (NAPROSYN) 500 MG tablet Take 1 tablet (500 mg total) by mouth 2 (two) times daily with a meal. 05/31/16   Hagler, Jami L, PA-C  penicillin v potassium (VEETID) 500 MG tablet Take 1 tablet (500 mg total) by mouth 4 (four) times daily. 03/23/17 03/30/17  Phineas Semen, MD  sulfamethoxazole-trimethoprim (BACTRIM DS,SEPTRA DS) 800-160 MG tablet Take 1 tablet by mouth 2 (two) times daily. 03/26/17   Becky Colan M, PA-C     Allergies Patient has no known allergies.  No family history on file.  Social History Social History  Substance Use Topics  . Smoking status: Current Every Day Smoker    Packs/day: 0.50    Types: Cigarettes  . Smokeless tobacco: Former Neurosurgeon  . Alcohol use No    Review of Systems Constitutional: Negative for fever/chills Eyes: No visual changes. ENT:  Negative for sore throat and for difficulty swallowing Cardiovascular: Denies chest pain. Respiratory: Denies cough. Denies shortness of breath. Gastrointestinal: No abdominal pain.  No nausea, vomiting, diarrhea. Genitourinary: Negative for dysuria. Musculoskeletal: Negative for back pain. Skin: Several and bite wounds along the right forearm. Neurological: Negative for headaches.   ____________________________________________   PHYSICAL EXAM:  VITAL SIGNS: ED Triage Vitals  Enc Vitals Group     BP 03/26/17 1709 126/71     Pulse Rate 03/26/17 1709 95     Resp 03/26/17 1709 16     Temp 03/26/17 1709 99 F (37.2 C)     Temp Source 03/26/17 1709 Oral     SpO2 03/26/17 1709 96 %     Weight 03/26/17 1708 145 lb (65.8 kg)     Height 03/26/17 1708 5\' 8"  (1.727 m)     Head Circumference --      Peak Flow --      Pain Score --      Pain Loc --      Pain Edu? --      Excl. in GC? --     Constitutional: Alert and oriented. Well appearing and  in no acute distress.  Eyes: Conjunctivae are normal. PERRL. EOMI  Head: Normocephalic and atraumatic. ENT:      Ears: Canals clear. TMs intact bilaterally.      Nose: No congestion/rhinnorhea.      Mouth/Throat: Mucous membranes are moist. Neck:Supple. No thyromegaly. No stridor.  Cardiovascular: Normal rate, regular rhythm. Normal S1 and S2.  Good peripheral circulation. Respiratory: Normal respiratory effort without tachypnea or retractions. Lungs CTAB. Good air entry to the bases with no decreased or absent breath sounds. Hematological/Lymphatic/Immunological: No cervical  lymphadenopathy. Cardiovascular: Normal rate, regular rhythm. Normal distal pulses. Respiratory: Normal respiratory effort. No wheezes/rales/rhonchi. Lungs CTAB with no W/R/R. Gastrointestinal: Bowel sounds 4 quadrants. Soft and nontender to palpation. No guarding or rigidity. No palpable masses. No distention. No CVA tenderness. Musculoskeletal: Nontender with normal range of motion in all extremities. Neurologic: Normal speech and language.  Skin:  Skin is warm, dry and intact. No rash noted. Several ant bite wounds along the right forearm with mild erythema and fluid filled vesicle without eruption. Psychiatric: Mood and affect are normal. Speech and behavior are normal. Patient exhibits appropriate insight and judgement.  ____________________________________________   LABS (all labs ordered are listed, but only abnormal results are displayed)  Labs Reviewed - No data to display ____________________________________________  EKG None ____________________________________________  RADIOLOGY None ____________________________________________   PROCEDURES  Procedure(s) performed: no    Critical Care performed: no ____________________________________________   INITIAL IMPRESSION / ASSESSMENT AND PLAN / ED COURSE  Pertinent labs & imaging results that were available during my care of the patient were reviewed by me and considered in my medical decision making (see chart for details).  Patient presents to emergency department with an bite wounds on the right forearm. History and physical exam findings are reassuring consistent with wound areas are consistent with ant bites. Patients antibiotic coverage will be adjusted to Bactrim to cover current dental abscess and skin staph infection coverage. Patient advised to follow up with PCP as needed or return to the emergency department if symptoms return or worsen. Patient informed of clinical course, understand medical decision-making  process, and agree with plan. Patient was advised to follow up with PCP as needed and was also advised to return to the emergency department for symptoms that change or worsen.    ____________________________________________   FINAL CLINICAL IMPRESSION(S) / ED DIAGNOSES  Final diagnoses:  Fire ant bite, accidental or unintentional, initial encounter  Rash and nonspecific skin eruption       NEW MEDICATIONS STARTED DURING THIS VISIT:  Discharge Medication List as of 03/26/2017  7:19 PM    START taking these medications   Details  sulfamethoxazole-trimethoprim (BACTRIM DS,SEPTRA DS) 800-160 MG tablet Take 1 tablet by mouth 2 (two) times daily., Starting Wed 03/26/2017, Print         Note:  This document was prepared using Dragon voice recognition software and may include unintentional dictation errors.    Percell BostonLittle, Tanesia Butner M, PA-C 03/26/17 2016    Governor RooksLord, Rebecca, MD 03/26/17 [phone]

## 2017-03-26 NOTE — ED Triage Notes (Signed)
Red rash to right arm x 1 day.  Patient started on PCN on Sunday and also bitten by ants on Saturday.    Rash not itchy.

## 2017-12-30 ENCOUNTER — Emergency Department
Admission: EM | Admit: 2017-12-30 | Discharge: 2017-12-30 | Disposition: A | Discharge status: Home / Self Care | Payer: Medicare PPO | Attending: Emergency Medicine | Admitting: Emergency Medicine

## 2017-12-30 ENCOUNTER — Encounter: Payer: Self-pay | Admitting: Emergency Medicine

## 2017-12-30 DIAGNOSIS — F17210 Nicotine dependence, cigarettes, uncomplicated: Secondary | ICD-10-CM | POA: Insufficient documentation

## 2017-12-30 DIAGNOSIS — J449 Chronic obstructive pulmonary disease, unspecified: Secondary | ICD-10-CM | POA: Diagnosis not present

## 2017-12-30 DIAGNOSIS — Z760 Encounter for issue of repeat prescription: Secondary | ICD-10-CM | POA: Insufficient documentation

## 2017-12-30 DIAGNOSIS — Z79899 Other long term (current) drug therapy: Secondary | ICD-10-CM | POA: Insufficient documentation

## 2017-12-30 MED ORDER — CLONAZEPAM 1 MG PO TABS: 1.0000 mg | ORAL_TABLET | Freq: Every evening | ORAL | 0 refills | Status: DC | PRN

## 2017-12-30 NOTE — Discharge Instructions (Signed)
Follow-up with schedule [facility] appointment on 06 Jan 2018.

## 2017-12-30 NOTE — ED Triage Notes (Signed)
Pt has appt with [facility] on 5/21 but needs this med refilled before then.

## 2017-12-30 NOTE — ED Provider Notes (Signed)
[facility] Emergency Department Provider Note   ____________________________________________   First MD Initiated Contact with Patient 12/30/17 1021     (approximate)  I have reviewed the triage vital signs and the nursing notes.   HISTORY  Chief Complaint Medication Refill    HPI Omar Patterson is a 67 y.o. male patient presents with caseworker to request emergency refill Klonopin.  Patient has appointment with [facility] on 06 Jan 2018 but is out of his Klonopin.  Past Medical History:  Diagnosis Date  . Bipolar 1 disorder (HCC)   . COPD (chronic obstructive pulmonary disease) (HCC)   . Hyperlipidemia   . Hyponatremia   . Intellectual disability   . Osteoporosis   . Schizoaffective disorder (HCC)     There are no active problems to display for this patient.   History reviewed. No pertinent surgical history.  Prior to Admission medications   Medication Sig Start Date End Date Taking? Authorizing Provider  clonazePAM (KLONOPIN) 0.5 MG tablet Take 1 tablet (0.5 mg total) by mouth 2 (two) times daily. 03/04/16 03/04/17  Beers, Charmayne Sheer, PA-C  clonazePAM (KLONOPIN) 1 MG tablet Take 1 tablet (1 mg total) by mouth at bedtime as needed for anxiety. 12/30/17 12/30/18  Joni Reining, PA-C  naproxen (NAPROSYN) 500 MG tablet Take 1 tablet (500 mg total) by mouth 2 (two) times daily with a meal. 05/31/16   Hagler, Jami L, PA-C  sulfamethoxazole-trimethoprim (BACTRIM DS,SEPTRA DS) 800-160 MG tablet Take 1 tablet by mouth 2 (two) times daily. 03/26/17   Little, Traci M, PA-C    Allergies Patient has no known allergies.  No family history on file.  Social History Social History   Tobacco Use  . Smoking status: Current Every Day Smoker    Packs/day: 0.50    Types: Cigarettes  . Smokeless tobacco: Former Engineer, water Use Topics  . Alcohol use: No  . Drug use: Not on file    Review of Systems Constitutional: No fever/chills Eyes: No visual  changes. ENT: No sore throat. Cardiovascular: Denies chest pain. Respiratory: Denies shortness of breath. Gastrointestinal: No abdominal pain.  No nausea, no vomiting.  No diarrhea.  No constipation. Genitourinary: Negative for dysuria. Musculoskeletal: Negative for back pain. Skin: Negative for rash. Neurological: Negative for headaches, focal weakness or numbness. Psychiatric:Bipolar and schizoaffective disorder. Endocrine:Hyperlipidemia   ____________________________________________   PHYSICAL EXAM:  VITAL SIGNS: ED Triage Vitals  Enc Vitals Group     BP 12/30/17 1012 131/79     Pulse Rate 12/30/17 1012 80     Resp 12/30/17 1012 20     Temp 12/30/17 1012 97.8 F (36.6 C)     Temp Source 12/30/17 1012 Oral     SpO2 12/30/17 1012 100 %     Weight 12/30/17 1013 148 lb (67.1 kg)     Height 12/30/17 1013  (1.727 m)     Head Circumference --      Peak Flow --      Pain Score 12/30/17 1013 0     Pain Loc --      Pain Edu? --      Excl. in GC? --    Constitutional: Alert and oriented. Well appearing and in no acute distress. Cardiovascular: Normal rate, regular rhythm. Grossly normal heart sounds.  Good peripheral circulation. Respiratory: Normal respiratory effort.  No retractions. Lungs CTAB. Neurologic:  Normal speech and language. No gross focal neurologic deficits are appreciated. No gait instability. Skin:  Skin  is warm, dry and intact. No rash noted. Psychiatric: Mood and affect are normal. Speech and behavior are normal.  ____________________________________________   LABS (all labs ordered are listed, but only abnormal results are displayed)  Labs Reviewed - No data to display ____________________________________________  EKG   ____________________________________________  RADIOLOGY  ED MD interpretation:    Official radiology report(s): No results found.  ____________________________________________   PROCEDURES  Procedure(s) performed:  None  Procedures  Critical Care performed: No  ____________________________________________   INITIAL IMPRESSION / ASSESSMENT AND PLAN / ED COURSE  As part of my medical decision making, I reviewed the following data within the electronic MEDICAL RECORD NUMBER   Patient presents emergency medication refill of Klonopin.  Patient given prescription for 10 days.  Patient advised to follow-up with scheduled [facility] appointment.      ____________________________________________   FINAL CLINICAL IMPRESSION(S) / ED DIAGNOSES  Final diagnoses:  Encounter for medication refill     ED Discharge Orders        Ordered    clonazePAM (KLONOPIN) 1 MG tablet  At bedtime PRN     12/30/17 1034       Note:  This document was prepared using Dragon voice recognition software and may include unintentional dictation errors.    Joni Reining, PA-C 12/30/17 1039    Emily Filbert, MD 12/30/17 [phone]

## 2017-12-30 NOTE — ED Triage Notes (Signed)
Pt here with group home caregiver. Caregiver reports pt needs a refil on Klonopin  tablets. Pt usually goes to [facility] but has not been able to get in. Denies all other sx's.

## 2018-05-26 ENCOUNTER — Emergency Department
Admission: EM | Admit: 2018-05-26 | Discharge: 2018-05-26 | Disposition: A | Discharge status: Home / Self Care | Payer: Medicare PPO | Attending: Emergency Medicine | Admitting: Emergency Medicine

## 2018-05-26 ENCOUNTER — Other Ambulatory Visit: Payer: Self-pay

## 2018-05-26 ENCOUNTER — Emergency Department: Payer: Medicare PPO

## 2018-05-26 ENCOUNTER — Encounter: Payer: Self-pay | Admitting: Emergency Medicine

## 2018-05-26 DIAGNOSIS — R7611 Nonspecific reaction to tuberculin skin test without active tuberculosis: Secondary | ICD-10-CM | POA: Insufficient documentation

## 2018-05-26 DIAGNOSIS — F17210 Nicotine dependence, cigarettes, uncomplicated: Secondary | ICD-10-CM | POA: Diagnosis not present

## 2018-05-26 DIAGNOSIS — Z79899 Other long term (current) drug therapy: Secondary | ICD-10-CM | POA: Diagnosis not present

## 2018-05-26 DIAGNOSIS — J449 Chronic obstructive pulmonary disease, unspecified: Secondary | ICD-10-CM | POA: Diagnosis not present

## 2018-05-26 NOTE — Discharge Instructions (Addendum)
Chest x-ray was normal here, we asked that you follow closely back up with the health department with a PPD was placed and they will take it from there.  Return to the emergency room for cough or fevers or any other concerns.

## 2018-05-26 NOTE — ED Triage Notes (Signed)
Pt reports that he had a TB test done on his arm and they told him it was positive. There is a bruised circle on his left anticube area. Pt is not having any symptoms.

## 2018-05-26 NOTE — ED Notes (Signed)
First nurse spoke to Dr. Cyril Loosen about appropriate testing in triage. 2 view chest Xray ordered per MD verbal order. Pt given a mask to wear in lobby for precaution. No symptoms per pt at this time.

## 2018-05-26 NOTE — ED Provider Notes (Signed)
[facility] Emergency Department Provider Note  ____________________________________________   I have reviewed the triage vital signs and the nursing notes. Where available I have reviewed prior notes and, if possible and indicated, outside hospital notes.    HISTORY  Chief Complaint No chief complaint on file.    HPI Omar Patterson is a 67 y.o. male  Resents here today with a caretaker from group all, patient does have a history of schizoaffective disorder and is somewhat of a poor historian.  He is at his baseline according to caretaker.  Patient is here because he had a routine PPD placed at the health department, and then was supposed to have an outpatient chest x-ray but was brought to the emergency room by accident.  According to Marcelino Duster, at the [facility], patient had a PPD placed on Friday was read positive on Monday.  They will follow-up he is not supposed to be in the emergency room.  Patient himself has no complaints no cough no shortness of breath no weight loss no recent imprisonment denies HIV    Past Medical History:  Diagnosis Date  . Bipolar 1 disorder (HCC)   . COPD (chronic obstructive pulmonary disease) (HCC)   . Hyperlipidemia   . Hyponatremia   . Intellectual disability   . Osteoporosis   . Schizoaffective disorder (HCC)     There are no active problems to display for this patient.   History reviewed. No pertinent surgical history.  Prior to Admission medications   Medication Sig Start Date End Date Taking? Authorizing Provider  clonazePAM (KLONOPIN) 0.5 MG tablet Take 1 tablet (0.5 mg total) by mouth 2 (two) times daily. 03/04/16 03/04/17  Beers, Charmayne Sheer, PA-C  clonazePAM (KLONOPIN) 1 MG tablet Take 1 tablet (1 mg total) by mouth at bedtime as needed for anxiety. 12/30/17 12/30/18  Joni Reining, PA-C  naproxen (NAPROSYN) 500 MG tablet Take 1 tablet (500 mg total) by mouth 2 (two) times daily with a meal.  05/31/16   Hagler, Jami L, PA-C  sulfamethoxazole-trimethoprim (BACTRIM DS,SEPTRA DS) 800-160 MG tablet Take 1 tablet by mouth 2 (two) times daily. 03/26/17   Little, Traci M, PA-C    Allergies Patient has no known allergies.  History reviewed. No pertinent family history.  Social History Social History   Tobacco Use  . Smoking status: Current Every Day Smoker    Packs/day: 0.50    Types: Cigarettes  . Smokeless tobacco: Former Engineer, water Use Topics  . Alcohol use: No  . Drug use: Not on file    Review of Systems Constitutional: No fever/chills Eyes: No visual changes. ENT: No sore throat. No stiff neck no neck pain Cardiovascular: Denies chest pain. Respiratory: Denies shortness of breath. Gastrointestinal:   no vomiting.  No diarrhea.  No constipation. Genitourinary: Negative for dysuria. Musculoskeletal: Negative lower extremity swelling Skin: Negative for rash. Neurological: Negative for severe headaches, focal weakness or numbness.   ____________________________________________   PHYSICAL EXAM:  VITAL SIGNS: ED Triage Vitals  Enc Vitals Group     BP 05/26/18 1227 140/78     Pulse Rate 05/26/18 1227 88     Resp 05/26/18 1227 20     Temp 05/26/18 1227 98.4 F (36.9 C)     Temp Source 05/26/18 1227 Oral     SpO2 05/26/18 1227 99 %     Weight 05/26/18 1228 164 lb (74.4 kg)     Height 05/26/18 1228 5\' 8"  (1.727 m)  Head Circumference --      Peak Flow --      Pain Score 05/26/18 1227 0     Pain Loc --      Pain Edu? --      Excl. in GC? --     Constitutional: Alert and oriented. Well appearing and in no acute distress. Eyes: Conjunctivae are normal Head: Atraumatic HEENT: No congestion/rhinnorhea. Mucous membranes are moist.  Oropharynx non-erythematous Neck:   Nontender with no meningismus, no masses, no stridor Cardiovascular: Normal rate, regular rhythm. Grossly normal heart sounds.  Good peripheral circulation. Respiratory: Normal  respiratory effort.  No retractions. Lungs CTAB. Abdominal: Soft and nontender. No distention. No guarding no rebound Back:  There is no focal tenderness or step off.  there is no midline tenderness there are no lesions noted. there is no CVA tenderness  Musculoskeletal: No lower extremity tenderness, no upper extremity tenderness. No joint effusions, no DVT signs strong distal pulses no edema Neurologic:  Normal speech and language. No gross focal neurologic deficits are appreciated.  Skin:  Skin is warm, dry and intact. No rash noted.  An area of erythema and mild induration circular on the left forearm, it is approximately 15 mm duration Psychiatric: Mood and affect are normal. Speech and behavior are normal.  ____________________________________________   LABS (all labs ordered are listed, but only abnormal results are displayed)  Labs Reviewed - No data to display  Pertinent labs  results that were available during my care of the patient were reviewed by me and considered in my medical decision making (see chart for details). ____________________________________________  EKG  I personally interpreted any EKGs ordered by me or triage  ____________________________________________  RADIOLOGY  Pertinent labs & imaging results that were available during my care of the patient were reviewed by me and considered in my medical decision making (see chart for details). If possible, patient and/or family made aware of any abnormal findings.  Dg Chest 2 View  Result Date: 05/26/2018 CLINICAL DATA:  Rule out tuberculosis positive TB test EXAM: CHEST - 2 VIEW COMPARISON:  12/01/2008 FINDINGS: There is no focal parenchymal opacity. There is no pleural effusion or pneumothorax. The heart and mediastinal contours are unremarkable. The osseous structures are unremarkable. IMPRESSION: No active cardiopulmonary disease. Electronically Signed   By: Elige Ko   On: 05/26/2018 13:13    ____________________________________________    PROCEDURES  Procedure(s) performed: None  Procedures  Critical Care performed: None  ____________________________________________   INITIAL IMPRESSION / ASSESSMENT AND PLAN / ED COURSE  Pertinent labs & imaging results that were available during my care of the patient were reviewed by me and considered in my medical decision making (see chart for details).  Here with a positive PPD test but no symptoms of active disease with a negative chest x-ray, he is not supposed to be in the emergency room, this was supposed to be an outpatient chest x-ray, he has no emergency condition, I did call the health department, they are aware of the negative chest x-ray, they did not intend to send him here, they will follow closely as an outpatient, caretaker patient made aware of these findings and they will follow-up as well.    ____________________________________________   FINAL CLINICAL IMPRESSION(S) / ED DIAGNOSES  Final diagnoses:  None      This chart was dictated using voice recognition software.  Despite best efforts to proofread,  errors can occur which can change meaning.      McShane,  Rudy Jew, MD 05/26/18 [phone]

## 2018-05-26 NOTE — ED Notes (Signed)
Pt states they did a TB skin test just because they felt he needed one. Pt has a discolored nickel sized circle to the left FA with purple to pink coloring. Pt denies any cough or congestion, denies any pain or SOB.. Pt is asking for money upon leaving the room. Pt smells of alcohol.Marland Kitchen

## 2019-05-27 ENCOUNTER — Telehealth: Payer: Self-pay | Admitting: *Deleted

## 2019-05-27 NOTE — Telephone Encounter (Signed)
Attempted to contact patient and/or caregivers to arrange for lung screening scan. There is no answer or voicemail option at either the patient or his family listed in the EMR. I will attempt to contact at a later date.

## 2019-06-03 ENCOUNTER — Telehealth: Payer: Self-pay | Admitting: *Deleted

## 2019-06-03 DIAGNOSIS — Z87891 Personal history of nicotine dependence: Secondary | ICD-10-CM

## 2019-06-03 DIAGNOSIS — Z122 Encounter for screening for malignant neoplasm of respiratory organs: Secondary | ICD-10-CM

## 2019-06-03 NOTE — Telephone Encounter (Signed)
Received referral for initial lung cancer screening scan. Contacted patient's caregiver and obtained smoking history,(current, 37.5 pack year) as well as answering questions related to screening process. Patient denies signs of lung cancer such as weight loss or hemoptysis. Patient denies comorbidity that would prevent curative treatment if lung cancer were found. Patient is scheduled for shared decision making visit and CT scan on 06/08/19 at 2pm.

## 2019-06-08 ENCOUNTER — Inpatient Hospital Stay: Payer: Medicare PPO | Attending: Oncology | Admitting: Oncology

## 2019-06-08 ENCOUNTER — Other Ambulatory Visit: Payer: Self-pay

## 2019-06-08 ENCOUNTER — Ambulatory Visit
Admission: RE | Admit: 2019-06-08 | Discharge: 2019-06-08 | Disposition: A | Discharge status: Home / Self Care | Payer: Medicare PPO | Source: Ambulatory Visit | Attending: Oncology | Admitting: Oncology

## 2019-06-08 DIAGNOSIS — Z122 Encounter for screening for malignant neoplasm of respiratory organs: Secondary | ICD-10-CM | POA: Insufficient documentation

## 2019-06-08 DIAGNOSIS — Z87891 Personal history of nicotine dependence: Secondary | ICD-10-CM

## 2019-06-08 NOTE — Progress Notes (Signed)
Virtual Visit via Video Note  I connected with Omar Patterson on 06/08/19 at  2:00 PM EDT by a video enabled telemedicine application and verified that I am speaking with the correct person using two identifiers.  Location: Patient: [facility] Provider: Office   I discussed the limitations of evaluation and management by telemedicine and the availability of in person appointments. The patient expressed understanding and agreed to proceed.  I discussed the assessment and treatment plan with the patient. The patient was provided an opportunity to ask questions and all were answered. The patient agreed with the plan and demonstrated an understanding of the instructions.   The patient was advised to call back or seek an in-person evaluation if the symptoms worsen or if the condition fails to improve as anticipated.   In accordance with CMS guidelines, patient has met eligibility criteria including age, absence of signs or symptoms of lung cancer.  Social History   Tobacco Use  . Smoking status: Current Every Day Smoker    Packs/day: 0.50    Types: Cigarettes  . Smokeless tobacco: Former User  Substance Use Topics  . Alcohol use: No  . Drug use: Not on file      A shared decision-making session was conducted prior to the performance of CT scan. This includes one or more decision aids, includes benefits and harms of screening, follow-up diagnostic testing, over-diagnosis, false positive rate, and total radiation exposure.   Counseling on the importance of adherence to annual lung cancer LDCT screening, impact of co-morbidities, and ability or willingness to undergo diagnosis and treatment is imperative for compliance of the program.   Counseling on the importance of continued smoking cessation for former smokers; the importance of smoking cessation for current smokers, and information about tobacco cessation interventions have been given to patient including [place] [facility] and [address] quit [place]  programs.   Written order for lung cancer screening with LDCT has been given to the patient and any and all questions have been answered to the best of my abilities.    Yearly follow up will be coordinated by Shawn Perkins, Thoracic Navigator.  I provided 15 minutes of face-to-face video visit time during this encounter, and > 50% was spent counseling as documented under my assessment & plan.   Jennifer E Burns, NP  

## 2019-06-10 ENCOUNTER — Encounter: Payer: Self-pay | Admitting: *Deleted

## 2020-03-23 ENCOUNTER — Encounter: Payer: Self-pay | Admitting: Pulmonary Disease

## 2020-03-23 ENCOUNTER — Other Ambulatory Visit: Payer: Self-pay

## 2020-03-23 ENCOUNTER — Ambulatory Visit: Payer: Medicare PPO | Admitting: Pulmonary Disease

## 2020-03-23 ENCOUNTER — Telehealth: Payer: Self-pay | Admitting: Pulmonary Disease

## 2020-03-23 VITALS — BP 128/78 | HR 74 | Temp 97.8°F | Ht 68.0 in | Wt 198.0 lb

## 2020-03-23 DIAGNOSIS — R059 Cough, unspecified: Secondary | ICD-10-CM

## 2020-03-23 DIAGNOSIS — R05 Cough: Secondary | ICD-10-CM

## 2020-03-23 DIAGNOSIS — J449 Chronic obstructive pulmonary disease, unspecified: Secondary | ICD-10-CM

## 2020-03-23 DIAGNOSIS — F17210 Nicotine dependence, cigarettes, uncomplicated: Secondary | ICD-10-CM

## 2020-03-23 MED ORDER — OMEPRAZOLE 40 MG PO CPDR: 40.0000 mg | DELAYED_RELEASE_CAPSULE | Freq: Every day | ORAL | 2 refills | Status: DC

## 2020-03-23 MED ORDER — ALBUTEROL SULFATE HFA 108 (90 BASE) MCG/ACT IN AERS: 2.0000 | INHALATION_SPRAY | Freq: Four times a day (QID) | RESPIRATORY_TRACT | 1 refills | Status: DC | PRN

## 2020-03-23 NOTE — Progress Notes (Signed)
    Assessment & Plan:  1. Cough (Primary) - Pulmonary Function Test [facility] Only; Future  2. COPD suggested by initial evaluation  3. Tobacco dependence due to cigarettes   Patient Instructions  You need to quit smoking.  Smoking will cause and/or aggravate any cough.  We are going to schedule breathing tests (PFTs).  I recommend that you do not take any fish oil capsules as these can sometimes cause cough by worsening reflux.  We are going to give you a capsule/tablet to help with reflux this will be omeprazole  40 mg, take 1 daily.  You will get an as needed inhaler.  This will be albuterol  2 puffs every 6 hours as needed for wheezing, cough or shortness of breath.  We will see you in follow-up in 6 to 8 weeks time.  Please note: late entry documentation due to logistical difficulties during COVID-19 pandemic. This note is filed for information purposes only, and is not intended to be used for billing, nor does it represent the full scope/nature of the visit in question. Please see any associated scanned media linked to date of encounter for additional pertinent information.  Subjective:    HPI: Omar Patterson is a 69 y.o. male presenting to the pulmonology clinic on 03/23/2020 with report of: pulmonary consult (c/o non prod cough. )     Outpatient Encounter Medications as of 03/23/2020  Medication Sig   Calcium Carbonate-Vitamin D 600-400 MG-UNIT tablet 1 tablet daily.   Cholecalciferol (VITAMIN D3) 50 MCG (2000 UT) capsule Take 2,000 Units by mouth daily.   fluPHENAZine decanoate (PROLIXIN) 25 MG/ML injection Inject into the muscle.   lithium  carbonate 300 MG capsule Take 2 capsules by mouth daily.   mupirocin ointment (BACTROBAN) 2 % 1 application 3 (three) times daily.   Omega 3 1000 MG CAPS Take 1 capsule by mouth daily.   pravastatin (PRAVACHOL) 40 MG tablet Take 40 mg by mouth daily.   traZODone (DESYREL) 100 MG tablet Take 100 mg by mouth at bedtime.   [DISCONTINUED]  albuterol  (VENTOLIN  HFA) 108 (90 Base) MCG/ACT inhaler Inhale 2 puffs into the lungs every 6 (six) hours as needed for wheezing or shortness of breath (Cough).   [DISCONTINUED] albuterol  (VENTOLIN  HFA) 108 (90 Base) MCG/ACT inhaler Inhale 2 puffs into the lungs every 6 (six) hours as needed for wheezing or shortness of breath (Cough).   [DISCONTINUED] clonazePAM  (KLONOPIN ) 0.5 MG tablet Take 1 tablet (0.5 mg total) by mouth 2 (two) times daily.   [DISCONTINUED] clonazePAM  (KLONOPIN ) 1 MG tablet Take 1 tablet (1 mg total) by mouth at bedtime as needed for anxiety.   [DISCONTINUED] naproxen  (NAPROSYN ) 500 MG tablet Take 1 tablet (500 mg total) by mouth 2 (two) times daily with a meal.   [DISCONTINUED] omeprazole  (PRILOSEC) 40 MG capsule Take 1 capsule (40 mg total) by mouth daily.   [DISCONTINUED] omeprazole  (PRILOSEC) 40 MG capsule Take 1 capsule (40 mg total) by mouth daily.   [DISCONTINUED] sulfamethoxazole -trimethoprim  (BACTRIM  DS,SEPTRA  DS) 800-160 MG tablet Take 1 tablet by mouth 2 (two) times daily.   No facility-administered encounter medications on file as of 03/23/2020.      Objective:   Vitals:   03/23/20 0923  BP: 128/78  Pulse: 74  Temp: 97.8 F (36.6 C)  Height: 5' 8 (1.727 m)  Weight: 198 lb (89.8 kg)  SpO2: 100%  TempSrc: Temporal  BMI (Calculated): 30.11     Physical exam documentation is limited by delayed entry of information.

## 2020-03-23 NOTE — Telephone Encounter (Signed)
Contacted [facility] and canceled Rx for Prilosec and ventolin.  [facility] has [facility] at [phone]. Merlot with [facility] is aware and voiced her understanding.  Nothing further is needed.

## 2020-03-23 NOTE — Patient Instructions (Addendum)
You need to quit smoking.  Smoking will cause and/or aggravate any cough.  We are going to schedule breathing tests (PFTs).  I recommend that you do not take any fish oil capsules as these can sometimes cause cough by worsening reflux.  We are going to give you a capsule/tablet to help with reflux this will be omeprazole 40 mg, take 1 daily.  You will get an as needed inhaler.  This will be albuterol 2 puffs every 6 hours as needed for wheezing, cough or shortness of breath.  We will see you in follow-up in 6 to 8 weeks time.

## 2020-03-31 ENCOUNTER — Telehealth: Payer: Self-pay

## 2020-03-31 NOTE — Telephone Encounter (Signed)
ATC pt to relay date/time of covid test prior to PFT. Received busy dial.  Will call back.   [facility]/03/2020 [facility]

## 2020-04-03 ENCOUNTER — Other Ambulatory Visit
Admission: RE | Admit: 2020-04-03 | Discharge: 2020-04-03 | Disposition: A | Discharge status: Home / Self Care | Payer: Medicare PPO | Source: Ambulatory Visit | Attending: Pulmonary Disease | Admitting: Pulmonary Disease

## 2020-04-03 ENCOUNTER — Other Ambulatory Visit: Payer: Self-pay

## 2020-04-03 DIAGNOSIS — Z20822 Contact with and (suspected) exposure to covid-19: Secondary | ICD-10-CM | POA: Diagnosis not present

## 2020-04-03 DIAGNOSIS — Z01812 Encounter for preprocedural laboratory examination: Secondary | ICD-10-CM | POA: Diagnosis present

## 2020-04-03 LAB — SARS CORONAVIRUS 2 (TAT 6-24 HRS): SARS Coronavirus 2: NEGATIVE

## 2020-04-03 NOTE — Telephone Encounter (Signed)
Patient had covid test.  Will close encounter.

## 2020-04-03 NOTE — Telephone Encounter (Signed)
ATC x2. Received busy dial.

## 2020-04-04 ENCOUNTER — Other Ambulatory Visit: Payer: Self-pay

## 2020-04-04 ENCOUNTER — Ambulatory Visit: Payer: Medicare PPO | Attending: Pulmonary Disease

## 2020-04-04 DIAGNOSIS — J984 Other disorders of lung: Secondary | ICD-10-CM | POA: Diagnosis not present

## 2020-04-04 DIAGNOSIS — R05 Cough: Secondary | ICD-10-CM | POA: Insufficient documentation

## 2020-04-04 DIAGNOSIS — R059 Cough, unspecified: Secondary | ICD-10-CM

## 2020-04-04 MED ORDER — ALBUTEROL SULFATE (2.5 MG/3ML) 0.083% IN NEBU
2.5000 mg | INHALATION_SOLUTION | Freq: Once | RESPIRATORY_TRACT | Status: AC
  Administered 2020-04-04: 2.5 mg via RESPIRATORY_TRACT
  Filled 2020-04-04: qty 3

## 2020-04-05 LAB — PULMONARY FUNCTION TEST ARMC ONLY
DL/VA % pred: 89 %
DL/VA: 3.74 ml/min/mmHg/L
DLCO unc % pred: 72 %
DLCO unc: 18.08 ml/min/mmHg
FEF 25-75 Post: 1.33 L/sec
FEF 25-75 Pre: 1.49 L/sec
FEF2575-%Change-Post: -10 %
FEF2575-%Pred-Post: 53 %
FEF2575-%Pred-Pre: 59 %
FEV1-%Change-Post: 0 %
FEV1-%Pred-Post: 63 %
FEV1-%Pred-Pre: 63 %
FEV1-Post: 2.01 L
FEV1-Pre: 2.01 L
FEV1FVC-%Change-Post: -1 %
FEV1FVC-%Pred-Pre: 96 %
FEV6-%Change-Post: 1 %
FEV6-%Pred-Post: 70 %
FEV6-%Pred-Pre: 69 %
FEV6-Post: 2.83 L
FEV6-Pre: 2.78 L
FEV6FVC-%Change-Post: 0 %
FEV6FVC-%Pred-Post: 105 %
FEV6FVC-%Pred-Pre: 105 %
FVC-%Change-Post: 1 %
FVC-%Pred-Post: 66 %
FVC-%Pred-Pre: 65 %
FVC-Post: 2.83 L
FVC-Pre: 2.78 L
Post FEV1/FVC ratio: 71 %
Post FEV6/FVC ratio: 100 %
Pre FEV1/FVC ratio: 72 %
Pre FEV6/FVC Ratio: 100 %
RV % pred: 102 %
RV: 2.32 L
TLC % pred: 85 %
TLC: 5.62 L

## 2020-05-29 ENCOUNTER — Other Ambulatory Visit: Payer: Self-pay | Admitting: Pulmonary Disease

## 2020-06-01 ENCOUNTER — Other Ambulatory Visit: Payer: Self-pay | Admitting: *Deleted

## 2020-06-01 DIAGNOSIS — Z122 Encounter for screening for malignant neoplasm of respiratory organs: Secondary | ICD-10-CM

## 2020-06-01 DIAGNOSIS — Z87891 Personal history of nicotine dependence: Secondary | ICD-10-CM

## 2020-06-01 NOTE — Progress Notes (Signed)
Patient is a current smoker, 38.25 pack year. Denies change in insurance in past year.

## 2020-06-07 ENCOUNTER — Other Ambulatory Visit: Payer: Self-pay

## 2020-06-07 ENCOUNTER — Ambulatory Visit
Admission: RE | Admit: 2020-06-07 | Discharge: 2020-06-07 | Disposition: A | Discharge status: Home / Self Care | Payer: Medicare PPO | Source: Ambulatory Visit | Attending: Nurse Practitioner | Admitting: Nurse Practitioner

## 2020-06-07 DIAGNOSIS — Z122 Encounter for screening for malignant neoplasm of respiratory organs: Secondary | ICD-10-CM | POA: Diagnosis present

## 2020-06-07 DIAGNOSIS — Z87891 Personal history of nicotine dependence: Secondary | ICD-10-CM | POA: Diagnosis not present

## 2020-06-15 ENCOUNTER — Encounter: Payer: Self-pay | Admitting: *Deleted

## 2020-06-26 ENCOUNTER — Other Ambulatory Visit: Payer: Self-pay | Admitting: Pulmonary Disease

## 2020-09-10 ENCOUNTER — Emergency Department
Admission: EM | Admit: 2020-09-10 | Discharge: 2020-09-10 | Disposition: A | Discharge status: Home / Self Care | Payer: Medicare PPO | Source: Home / Self Care

## 2020-09-10 ENCOUNTER — Emergency Department: Payer: Medicare PPO

## 2020-09-10 ENCOUNTER — Encounter: Payer: Self-pay | Admitting: Radiology

## 2020-09-10 ENCOUNTER — Emergency Department
Admission: EM | Admit: 2020-09-10 | Discharge: 2020-09-10 | Disposition: A | Discharge status: Home / Self Care | Payer: Medicare PPO | Attending: Emergency Medicine | Admitting: Emergency Medicine

## 2020-09-10 ENCOUNTER — Other Ambulatory Visit: Payer: Self-pay

## 2020-09-10 DIAGNOSIS — J449 Chronic obstructive pulmonary disease, unspecified: Secondary | ICD-10-CM | POA: Diagnosis not present

## 2020-09-10 DIAGNOSIS — S82831A Other fracture of upper and lower end of right fibula, initial encounter for closed fracture: Secondary | ICD-10-CM | POA: Diagnosis not present

## 2020-09-10 DIAGNOSIS — Z5321 Procedure and treatment not carried out due to patient leaving prior to being seen by health care provider: Secondary | ICD-10-CM | POA: Insufficient documentation

## 2020-09-10 DIAGNOSIS — R2241 Localized swelling, mass and lump, right lower limb: Secondary | ICD-10-CM | POA: Insufficient documentation

## 2020-09-10 DIAGNOSIS — F17210 Nicotine dependence, cigarettes, uncomplicated: Secondary | ICD-10-CM | POA: Diagnosis not present

## 2020-09-10 DIAGNOSIS — W000XXA Fall on same level due to ice and snow, initial encounter: Secondary | ICD-10-CM | POA: Insufficient documentation

## 2020-09-10 DIAGNOSIS — S99911A Unspecified injury of right ankle, initial encounter: Secondary | ICD-10-CM | POA: Diagnosis present

## 2020-09-10 MED ORDER — ACETAMINOPHEN 325 MG PO TABS
650.0000 mg | ORAL_TABLET | Freq: Once | ORAL | Status: AC
  Administered 2020-09-10: 650 mg via ORAL
  Filled 2020-09-10: qty 2

## 2020-09-10 NOTE — ED Triage Notes (Signed)
Pt slipped on ice and hurt right ankle. Swelling present. From [facility].  

## 2020-09-10 NOTE — ED Notes (Signed)
Renae Fickle, Financial controller for [facility], will be picking up patient for discharge.

## 2020-09-10 NOTE — ED Triage Notes (Addendum)
Pt slipped on ice and hurt right ankle. Swelling present. From [facility].

## 2020-09-10 NOTE — ED Provider Notes (Signed)
[facility] Emergency Department Provider Note   ____________________________________________   Event Date/Time   First MD Initiated Contact with Patient 09/10/20 2125     (approximate)  I have reviewed the triage vital signs and the nursing notes.   HISTORY  Chief Complaint Right ankle  HPI Omar Patterson is a 70 y.o. male who presents to the emergency department today from a local group home for evaluation of ankle pain. The patient slipped on ice and hurt her ankle. The patient is a poor historian and there is not a member of the group home with him today. It is unclear exactly when the incident happened. He reports pain in the right ankle that is worse with walking. Unclear if any alleviating measures have been attempted.         Past Medical History:  Diagnosis Date  . Bipolar 1 disorder (HCC)   . COPD (chronic obstructive pulmonary disease) (HCC)   . Hyperlipidemia   . Hyponatremia   . Intellectual disability   . Osteoporosis   . Schizoaffective disorder (HCC)     There are no problems to display for this patient.   No past surgical history on file.  Prior to Admission medications   Medication Sig Start Date End Date Taking? Authorizing Provider  albuterol (VENTOLIN HFA) 108 (90 Base) MCG/ACT inhaler INHALE 2 PUFFS INTO LUNGS EVERY SIX HOURS AS NEEDED FOR WHEEZING OR SHORTNESS OF BREATH (COUGH) *SHAKE WELL* *WAIT 1 MINUTE BETWEEN EACH PUFF* *EXPIRES 12 MONTHS AFTER OPENING* CHECK EXPIRATION* *PRIME BEFORE FIRST USE, IF NOT USED > 2 WEEKS, OR IF DROPPED. TO PRIME, SPRAY 4 TIMES SHAKING WELL BEFORE EACH SPRAY* 05/29/20   Salena Saner, MD  Calcium Carbonate-Vitamin D 600-400 MG-UNIT tablet 1 tablet daily. 11/01/14   [provider]  Cholecalciferol (VITAMIN D3) 50 MCG (2000 UT) capsule Take 2,000 Units by mouth daily.    [provider]  fluPHENAZine decanoate (PROLIXIN) 25 MG/ML injection Inject into the muscle. 12/23/19    [provider]  lithium carbonate 300 MG capsule Take 2 capsules by mouth daily.    [provider]  mupirocin ointment (BACTROBAN) 2 % 1 application 3 (three) times daily.    [provider]  Omega 3 1000 MG CAPS Take 1 capsule by mouth daily.    [provider]  omeprazole (PRILOSEC) 40 MG capsule TAKE 1 CAPSULE BY MOUTH ONCE DAILY 06/26/20   Salena Saner, MD  pravastatin (PRAVACHOL) 40 MG tablet Take 40 mg by mouth daily.    [provider]  traZODone (DESYREL) 100 MG tablet Take 100 mg by mouth at bedtime.    [provider]    Allergies Patient has no known allergies.  Family History  Family history unknown: Yes    Social History Social History   Tobacco Use  . Smoking status: Current Every Day Smoker    Packs/day: 0.50    Years: 46.00    Pack years: 23.00    Types: Cigarettes  . Smokeless tobacco: Former Engineer, water Use Topics  . Alcohol use: No    Review of Systems Constitutional: No fever/chills Eyes: No visual changes. ENT: No sore throat. Cardiovascular: Denies chest pain. Respiratory: Denies shortness of breath. Gastrointestinal: No abdominal pain.  No nausea, no vomiting.  No diarrhea.  No constipation. Genitourinary: Negative for dysuria. Musculoskeletal: + Right ankle pain, negative for back pain. Skin: Negative for rash. Neurological: Negative for headaches, focal weakness or numbness.  ____________________________________________  PHYSICAL EXAM:  VITAL SIGNS: ED Triage Vitals  Enc Vitals Group     BP 09/10/20 2059 (!) 153/87     Pulse Rate 09/10/20 2059 93     Resp 09/10/20 2059 16     Temp 09/10/20 2059 99.5 F (37.5 C)     Temp Source 09/10/20 2059 Oral     SpO2 09/10/20 2059 99 %     Weight --      Height --      Head Circumference --      Peak Flow --      Pain Score 09/10/20 2334 2     Pain Loc --      Pain Edu? --      Excl. in GC? --    Constitutional: Alert and  oriented. Well appearing and in no acute distress. Eyes: Conjunctivae are normal. PERRL. EOMI. Head: Atraumatic. Nose: No congestion/rhinnorhea. Mouth/Throat: Mucous membranes are moist.  Neck: No stridor.   Cardiovascular: Normal rate, regular rhythm. Grossly normal heart sounds.  Good peripheral circulation. Respiratory: Normal respiratory effort.  No retractions. Lungs CTAB. Gastrointestinal: Soft and nontender. No distention. No abdominal bruits. No CVA tenderness. Musculoskeletal: There is a mild amount of swelling to the lateral aspect of the right ankle. No ecchymosis is present. There is tenderness to palpation of the lateral malleolus and surrounding soft tissue. No tenderness to palpation of the proximal fifth metatarsal. No tenderness to palpation of the medial malleolus. Patient has range of motion though it is limited at end ranges due to pain. Dorsal pedal pulse 2+, capillary refill less than 3 seconds. Neurologic:  Normal speech and language. No gross focal neurologic deficits are appreciated. No gait instability. Skin:  Skin is warm, dry and intact. No rash noted. Psychiatric: Mood and affect are normal. Speech and behavior are normal.  ____________________________________________  RADIOLOGY I, Lucy Chris, personally viewed and evaluated these images (plain radiographs) as part of my medical decision making, as well as reviewing the written report by the radiologist.  ED provider interpretation: Possible fracture of the lateral malleolus  Official radiology report(s): DG Ankle Complete Right  Result Date: 09/10/2020 CLINICAL DATA:  Larey Seat, right ankle pain, swelling EXAM: RIGHT ANKLE - COMPLETE 3+ VIEW COMPARISON:  None. FINDINGS: Frontal, oblique, lateral views of the right ankle are obtained. On the lateral view, there is a small lucency within the inferior posterior margin of the lateral malleolus, not seen on the other views. This could reflect a nondisplaced  fracture. Joint spaces are well preserved. Ankle mortise is intact. Small inferior calcaneal spur. Soft tissues are unremarkable. IMPRESSION: 1. Suspected nondisplaced fracture of the inferior posterior margin of the lateral malleolus, only seen on lateral view. Electronically Signed   By: Sharlet Salina M.D.   On: 09/10/2020 20:02    ____________________________________________   INITIAL IMPRESSION / ASSESSMENT AND PLAN / ED COURSE  As part of my medical decision making, I reviewed the following data within the electronic MEDICAL RECORD NUMBER Nursing notes reviewed and incorporated and Radiograph reviewed         Patient is a 70 year old male with history significant for intellectual disability who presents to the emergency department for evaluation of right ankle pain. See HPI for further details. In triage, the patient has normal vital signs. On physical exam, there is a mild to moderate amount of swelling over the right ankle, particularly the lateral aspect. This is also the location of patient's tenderness. Patient has range of motion though is  mildly limited secondary to pain. He has normal neurovascular exam. X-ray demonstrates possible fracture of lateral malleolus. Patient will be placed in a cam walker boot and given ambulation assistance with walker. Will recommend Tylenol or ibuprofen for the patient's pain. Will recommend follow-up with orthopedics. Patient is amenable with this plan and this was discussed with the member of the group home who is also amenable with plan.      ____________________________________________   FINAL CLINICAL IMPRESSION(S) / ED DIAGNOSES  Final diagnoses:  Closed fracture of distal end of right fibula, unspecified fracture morphology, initial encounter     ED Discharge Orders    None      *Please note:  Omar Patterson was evaluated in Emergency Department on 09/10/2020 for the symptoms described in the history of present illness. He was evaluated in  the context of the global COVID-19 pandemic, which necessitated consideration that the patient might be at risk for infection with the SARS-CoV-2 virus that causes COVID-19. Institutional protocols and algorithms that pertain to the evaluation of patients at risk for COVID-19 are in a state of rapid change based on information released by regulatory bodies including the CDC and federal and state organizations. These policies and algorithms were followed during the patient's care in the ED.  Some ED evaluations and interventions may be delayed as a result of limited staffing during and the pandemic.*   Note:  This document was prepared using Dragon voice recognition software and may include unintentional dictation errors.   Lucy Chris, PA 09/10/20 2347    Sharman Cheek, MD 09/11/20 [phone]

## 2020-09-10 NOTE — ED Notes (Signed)
Put Boot on pt and made sure pt could walk with boot and crutches. Pt is seated in back in wheel chair.

## 2020-09-10 NOTE — ED Notes (Signed)
Pt provided with walker.

## 2020-09-10 NOTE — Discharge Instructions (Signed)
You have a fracture of your ankle. Please use CAM walker boot and crutches until follow up with orthopedics. You may alternate tylenol and ibuprofen as needed for your pain.

## 2020-09-10 NOTE — ED Notes (Addendum)
Attempted to contact patient's brother for discharge. Phone number in chart is incorrect.

## 2020-09-11 ENCOUNTER — Encounter: Payer: Self-pay | Admitting: Radiology

## 2021-11-06 ENCOUNTER — Telehealth: Payer: Self-pay | Admitting: Acute Care

## 2021-11-06 NOTE — Telephone Encounter (Signed)
Attempted to reach both numbers listed for pt.  The first one seemed to be attached to a fax machine.  The second number also listed as his brother's number was not available with no way to leave message. ?

## 2022-01-01 ENCOUNTER — Emergency Department: Payer: Medicare PPO

## 2022-01-01 ENCOUNTER — Emergency Department
Admission: EM | Admit: 2022-01-01 | Discharge: 2022-01-01 | Disposition: A | Discharge status: Home / Self Care | Payer: Medicare PPO | Attending: Emergency Medicine | Admitting: Emergency Medicine

## 2022-01-01 ENCOUNTER — Other Ambulatory Visit: Payer: Self-pay

## 2022-01-01 DIAGNOSIS — R531 Weakness: Secondary | ICD-10-CM | POA: Diagnosis not present

## 2022-01-01 DIAGNOSIS — J189 Pneumonia, unspecified organism: Secondary | ICD-10-CM | POA: Insufficient documentation

## 2022-01-01 DIAGNOSIS — R42 Dizziness and giddiness: Secondary | ICD-10-CM | POA: Insufficient documentation

## 2022-01-01 DIAGNOSIS — Z20822 Contact with and (suspected) exposure to covid-19: Secondary | ICD-10-CM | POA: Insufficient documentation

## 2022-01-01 DIAGNOSIS — J449 Chronic obstructive pulmonary disease, unspecified: Secondary | ICD-10-CM | POA: Diagnosis not present

## 2022-01-01 LAB — URINALYSIS, COMPLETE (UACMP) WITH MICROSCOPIC
Bacteria, UA: NONE SEEN
Bilirubin Urine: NEGATIVE
Glucose, UA: NEGATIVE mg/dL
Hgb urine dipstick: NEGATIVE
Ketones, ur: NEGATIVE mg/dL
Leukocytes,Ua: NEGATIVE
Nitrite: NEGATIVE
Protein, ur: NEGATIVE mg/dL
Specific Gravity, Urine: 1.004 — ABNORMAL LOW (ref 1.005–1.030)
pH: 7 (ref 5.0–8.0)

## 2022-01-01 LAB — CBC
HCT: 46.6 % (ref 39.0–52.0)
Hemoglobin: 15 g/dL (ref 13.0–17.0)
MCH: 30.7 pg (ref 26.0–34.0)
MCHC: 32.2 g/dL (ref 30.0–36.0)
MCV: 95.5 fL (ref 80.0–100.0)
Platelets: 247 10*3/uL (ref 150–400)
RBC: 4.88 MIL/uL (ref 4.22–5.81)
RDW: 13.5 % (ref 11.5–15.5)
WBC: 14.9 10*3/uL — ABNORMAL HIGH (ref 4.0–10.5)
nRBC: 0 % (ref 0.0–0.2)

## 2022-01-01 LAB — HEPATIC FUNCTION PANEL
ALT: 14 U/L (ref 0–44)
AST: 17 U/L (ref 15–41)
Albumin: 3.9 g/dL (ref 3.5–5.0)
Alkaline Phosphatase: 68 U/L (ref 38–126)
Bilirubin, Direct: 0.1 mg/dL (ref 0.0–0.2)
Indirect Bilirubin: 0.7 mg/dL (ref 0.3–0.9)
Total Bilirubin: 0.8 mg/dL (ref 0.3–1.2)
Total Protein: 7.1 g/dL (ref 6.5–8.1)

## 2022-01-01 LAB — RESP PANEL BY RT-PCR (FLU A&B, COVID) ARPGX2
Influenza A by PCR: NEGATIVE
Influenza B by PCR: NEGATIVE
SARS Coronavirus 2 by RT PCR: NEGATIVE

## 2022-01-01 LAB — LITHIUM LEVEL: Lithium Lvl: 0.47 mmol/L — ABNORMAL LOW (ref 0.60–1.20)

## 2022-01-01 LAB — BASIC METABOLIC PANEL
Anion gap: 9 (ref 5–15)
BUN: 9 mg/dL (ref 8–23)
CO2: 20 mmol/L — ABNORMAL LOW (ref 22–32)
Calcium: 9.1 mg/dL (ref 8.9–10.3)
Chloride: 107 mmol/L (ref 98–111)
Creatinine, Ser: 0.98 mg/dL (ref 0.61–1.24)
GFR, Estimated: >60 mL/min (ref >60)
Glucose, Bld: 89 mg/dL (ref 70–99)
Potassium: 4.2 mmol/L (ref 3.5–5.1)
Sodium: 136 mmol/L (ref 135–145)

## 2022-01-01 LAB — TROPONIN I (HIGH SENSITIVITY): Troponin I (High Sensitivity): 4 ng/L (ref <18)

## 2022-01-01 MED ORDER — DOXYCYCLINE HYCLATE 100 MG PO CAPS: 100.0000 mg | ORAL_CAPSULE | Freq: Two times a day (BID) | ORAL | 0 refills | Status: AC

## 2022-01-01 MED ORDER — LACTATED RINGERS IV BOLUS
1500.0000 mL | Freq: Once | INTRAVENOUS | Status: AC
  Administered 2022-01-01: 1500 mL via INTRAVENOUS

## 2022-01-01 NOTE — ED Notes (Signed)
Received report from Bethany RN.

## 2022-01-01 NOTE — ED Notes (Signed)
Marnet picked up the pt, cleared for discharge. ?

## 2022-01-01 NOTE — ED Notes (Signed)
Group home aid Marnet to pick up the pt. ?

## 2022-01-01 NOTE — ED Provider Notes (Signed)
? ?[facility] ?Provider Note ? ? ? Event Date/Time  ? First MD Initiated Contact with Patient 01/01/22 1616   ?  (approximate) ? ? ?History  ? ?Weakness ? ? ?HPI ? ?Omar Patterson is a 71 y.o. male with a past medical history of COPD and ongoing tobacco abuse, HDL, hyponatremia, some intellectual disability, osteoporosis, schizoaffective disorder and bipolar disorder on lithium who presents from adult day program for evaluation of episode of dizziness and generalized weakness he experienced earlier today.  He states he is feeling much better now.  He did not lose consciousness or fall. ? ?  ?Past Medical History:  ?Diagnosis Date  ? Bipolar 1 disorder (HCC)   ? COPD (chronic obstructive pulmonary disease) (HCC)   ? Hyperlipidemia   ? Hyponatremia   ? Intellectual disability   ? Osteoporosis   ? Schizoaffective disorder (HCC)   ? ? ? ?Physical Exam  ?Triage Vital Signs: ?ED Triage Vitals  ?Enc Vitals Group  ?   BP 01/01/22 1230 114/88  ?   Pulse Rate 01/01/22 1230 75  ?   Resp 01/01/22 1230 18  ?   Temp 01/01/22 1230 98.8 ?F (37.1 ?C)  ?   Temp Source 01/01/22 1230 Oral  ?   SpO2 01/01/22 1230 95 %  ?   Weight --   ?   Height --   ?   Head Circumference --   ?   Peak Flow --   ?   Pain Score 01/01/22 1233 0  ?   Pain Loc --   ?   Pain Edu? --   ?   Excl. in GC? --   ? ? ?Most recent vital signs: ?Vitals:  ? 01/01/22 1930 01/01/22 2000  ?BP: (!) 140/100 (!) 131/93  ?Pulse: (!) 51 98  ?Resp:    ?Temp:    ?SpO2: 98% 97%  ? ? ?General: Awake, no distress.  ?CV:  Good peripheral perfusion.  2+ bilateral radial and PT pulses. ?Resp:  Normal effort.  Diminished at the left base.  Otherwise clear. ?Abd:  No distention.  Soft. ?Other:  Cranial nerves II through XII grossly intact.  No pronator drift.  No finger dysmetria.  Symmetric 5/5 strength of all extremities.  Sensation intact to light touch in all extremities.  Unremarkable unassisted gait. ? ? ? ?ED Results / Procedures / Treatments  ?Labs ?(all  labs ordered are listed, but only abnormal results are displayed) ?Labs Reviewed  ?BASIC METABOLIC PANEL - Abnormal; Notable for the following components:  ?    Result Value  ? CO2 20 (*)   ? All other components within normal limits  ?CBC - Abnormal; Notable for the following components:  ? WBC 14.9 (*)   ? All other components within normal limits  ?URINALYSIS, COMPLETE (UACMP) WITH MICROSCOPIC - Abnormal; Notable for the following components:  ? Color, Urine YELLOW (*)   ? APPearance CLEAR (*)   ? Specific Gravity, Urine 1.004 (*)   ? All other components within normal limits  ?LITHIUM LEVEL - Abnormal; Notable for the following components:  ? Lithium Lvl 0.47 (*)   ? All other components within normal limits  ?RESP PANEL BY RT-PCR (FLU A&B, COVID) ARPGX2  ?HEPATIC FUNCTION PANEL  ?CBG MONITORING, ED  ?TROPONIN I (HIGH SENSITIVITY)  ? ? ? ?EKG ? ?ECG is remarkable for sinus rhythm with ventricular rate of 82, normal axis with nonspecific ST change in lead III without other clear evidence  of acute ischemia or significant arrhythmia. ? ? ?RADIOLOGY ?Chest x-ray my interpretation without overt edema, large effusion, pneumothorax or clear focal consolidation.  I reviewed radiologist interpretation and agree to findings of atelectasis versus possible pneumonia in the left lung as well as evidence of previously noted pleural parenchymal scarring and aortic atherosclerosis. ? ? ?PROCEDURES: ? ?Critical Care performed: No ? ?.1-3 Lead EKG Interpretation ?Performed by: Gilles Chiquito, MD ?Authorized by: Gilles Chiquito, MD  ? ?  Interpretation: normal   ?  ECG rate assessment: normal   ?  Rhythm: sinus rhythm   ?  Ectopy: none   ?  Conduction: normal   ? ?The patient is on the cardiac monitor to evaluate for evidence of arrhythmia and/or significant heart rate changes. ? ? ?MEDICATIONS ORDERED IN ED: ?Medications  ?lactated ringers bolus 1,500 mL (0 mLs Intravenous Stopped 01/01/22 1912)  ? ? ? ?IMPRESSION / MDM /  ASSESSMENT AND PLAN / ED COURSE  ?I reviewed the triage vital signs and the nursing notes. ?             ?               ? ?Differential diagnosis includes, but is not limited to ACS, arrhythmia, anemia, acute infectious process, supratherapeutic level of lithium and dehydration.  There are no focal deficits to suggest CVA or any history exam features to suggest trauma or toxic ingestion. ? ?ECG is remarkable for sinus rhythm with ventricular rate of 82, normal axis with nonspecific ST change in lead III without other clear evidence of acute ischemia or significant arrhythmia.  Nonelevated troponin is not suggestive of ACS or myocarditis. ? ?Chest x-ray my interpretation without overt edema, large effusion, pneumothorax or clear focal consolidation.  I reviewed radiologist interpretation and agree to findings of atelectasis versus possible pneumonia in the left lung as well as evidence of previously noted pleural parenchymal scarring and aortic atherosclerosis. ? ?BMP shows bicarb of 20 but no other significant electrolyte or metabolic derangements.  CBC shows WBC count of 14.9 with normal hemoglobin and platelets.  COVID influenza PCR is negative.  Hepatic function panel is unremarkable.  UA without evidence of infection.  Lithium level is actually subtherapeutic at 0.47.  Advised patient to follow-up with his prescribing doctor for this to see if need to have his regiment adjusted.  On several reassessments he states he is feeling much better without any ongoing symptoms.  I am concerned for possible early commune acquired pneumonia and will cover with a course of doxycycline.  He otherwise does not appear septic and has no evidence of respiratory failure and have a low suspicion for immediate life-threatening process.  At this point I think he is stable for discharge with outpatient follow-up.  Discharged in stable condition.  Strict return precautions advised and discussed.  ?  ? ? ?FINAL CLINICAL IMPRESSION(S)  / ED DIAGNOSES  ? ?Final diagnoses:  ?Weakness  ?Community acquired pneumonia of left lung, unspecified part of lung  ? ? ? ?Rx / DC Orders  ? ?ED Discharge Orders   ? ?      Ordered  ?  doxycycline (VIBRAMYCIN) 100 MG capsule  2 times daily       ? 01/01/22 2114  ? ?  ?  ? ?  ? ? ? ?Note:  This document was prepared using Dragon voice recognition software and may include unintentional dictation errors. ?  ?Gilles Chiquito, MD ?01/01/22 2117 ? ?

## 2022-01-01 NOTE — ED Triage Notes (Signed)
Pt comes with c/o weakness. Pt comes via EMs from an adult day care program. Pt states dizziness. ? ?VSS per EMS. ?

## 2022-01-01 NOTE — ED Notes (Signed)
Marnet called from the group home for pt pick up, pending call back for eta. ?

## 2022-01-01 NOTE — ED Notes (Signed)
Pt is confused baseline. Stating he wants to smoke a cigarette, pt was offered a nicotine patch by the MD, but pt declined. Pt is asking to go sit in the lobby. Pt is redirectable and advised to stay in the room until discharge. ?

## 2023-10-09 ENCOUNTER — Other Ambulatory Visit: Payer: Self-pay

## 2023-10-09 ENCOUNTER — Emergency Department: Payer: Medicare HMO

## 2023-10-09 ENCOUNTER — Emergency Department
Admission: EM | Admit: 2023-10-09 | Discharge: 2023-10-09 | Disposition: A | Discharge status: Home / Self Care | Payer: Medicare HMO | Attending: Emergency Medicine | Admitting: Emergency Medicine

## 2023-10-09 DIAGNOSIS — S0990XA Unspecified injury of head, initial encounter: Secondary | ICD-10-CM | POA: Insufficient documentation

## 2023-10-09 DIAGNOSIS — H05231 Hemorrhage of right orbit: Secondary | ICD-10-CM

## 2023-10-09 DIAGNOSIS — S0993XA Unspecified injury of face, initial encounter: Secondary | ICD-10-CM | POA: Diagnosis present

## 2023-10-09 DIAGNOSIS — W19XXXA Unspecified fall, initial encounter: Secondary | ICD-10-CM | POA: Insufficient documentation

## 2023-10-09 DIAGNOSIS — S0011XA Contusion of right eyelid and periocular area, initial encounter: Secondary | ICD-10-CM | POA: Diagnosis not present

## 2023-10-09 NOTE — ED Triage Notes (Signed)
 First nurse note: pt to ED [facility] from [facility] for right eye injury from fall 2 days ago. Denies vision problems

## 2023-10-09 NOTE — Discharge Instructions (Addendum)
Use Tylenol for pain and fevers.  Up to 1000 mg per dose, up to 4 times per day.  Do not take more than 4000 mg of Tylenol/acetaminophen within 24 hours..  

## 2023-10-09 NOTE — ED Provider Notes (Signed)
 [facility] Provider Note    Event Date/Time   First MD Initiated Contact with Patient 10/09/23 1130     (approximate)   History   Eye Problem   HPI  Omar Patterson is a 73 y.o. male who presents to the ED for evaluation of Eye Problem   Patient presents because of bruising around his right eye after a fall that occurred 2 days ago.  Reports that he lives at a group home, he stood up quickly and felt dizzy, falling back and striking the right side of his face.  Reports his vision is at baseline, no other injuries.  No other concerns.  History of intellectual disability, schizoaffective, bipolar disorder.  Resides at local group home.   Physical Exam   Triage Vital Signs: ED Triage Vitals [10/09/23 1056]  Encounter Vitals Group     BP (!) 157/95     Systolic BP Percentile      Diastolic BP Percentile      Pulse Rate (!) 105     Resp 18     Temp 99.6 F (37.6 C)     Temp Source Oral     SpO2 95 %     Weight 199 lb 15.3 oz (90.7 kg)     Height 5\' 8"  (1.727 m)     Head Circumference      Peak Flow      Pain Score 5     Pain Loc      Pain Education      Exclude from Growth Chart     Most recent vital signs: Vitals:   10/09/23 1056  BP: (!) 157/95  Pulse: (!) 105  Resp: 18  Temp: 99.6 F (37.6 C)  SpO2: 95%    General: Awake, no distress.  CV:  Good peripheral perfusion.  Resp:  Normal effort.  Abd:  No distention.  MSK:  No deformity noted.  Neuro:  No focal deficits appreciated.  Palpation of all 4 extremities without deformity, tenderness or trauma. Other:  Mild right sided periorbital bruising. No proptosis. Pupils PERRL. EOM intact without signs of entrapment   ED Results / Procedures / Treatments   Labs (all labs ordered are listed, but only abnormal results are displayed) Labs Reviewed - No data to display  EKG   RADIOLOGY CT head interpreted by me without evidence of acute intracranial pathology  Official  radiology report(s): CT HEAD WO CONTRAST ( ) Result Date: 10/09/2023 CLINICAL DATA:  73 year old male with fall 2 days ago. Injury. Neurologic deficit. EXAM: CT HEAD WITHOUT CONTRAST TECHNIQUE: Contiguous axial images were obtained from the base of the skull through the vertex without intravenous contrast. RADIATION DOSE REDUCTION: This exam was performed according to the departmental dose-optimization program which includes automated exposure control, adjustment of the mA and/or kV according to patient size and/or use of iterative reconstruction technique. COMPARISON:  None Available. FINDINGS: Brain: Cerebral volume is within normal limits for age. No midline shift, ventriculomegaly, mass effect, evidence of mass lesion, intracranial hemorrhage or evidence of cortically based acute infarction. Mild for age asymmetric white matter hypodensity, slightly greater in the right hemisphere. Vascular: No suspicious intracranial vascular hyperdensity. Skull: Intact.  No acute osseous abnormality identified. Sinuses/Orbits: Mild bilateral paranasal sinus mucosal thickening. No layering sinus hemorrhage. Tympanic cavities and mastoids well aerated. Other: No orbit or scalp soft tissue injury identified. IMPRESSION: 1. No acute intracranial abnormality or acute traumatic injury identified. 2. Mild for age white matter changes most  commonly due to small vessel disease. 3. Mild paranasal sinus inflammation. Electronically Signed   By: Odessa Fleming M.D.   On: 10/09/2023 11:58    PROCEDURES and INTERVENTIONS:  Procedures  Medications - No data to display   IMPRESSION / MDM / ASSESSMENT AND PLAN / ED COURSE  I reviewed the triage vital signs and the nursing notes.  Differential diagnosis includes, but is not limited to, ICH, retrobulbar hematoma, skull fracture, cardiac dysrhythmia, syncope, seizure  {Patient presents with symptoms of an acute illness or injury that is potentially life-threatening.  Patient  presents 2 days after a minor head injury with some mild bruising but no other signs of significant pathology, suitable for outpatient management.  Reassuring exam.  No signs of entrapment or other trauma.  Imaging is benign.  He is up and ambulatory around the department.  Suitable for outpatient management.      FINAL CLINICAL IMPRESSION(S) / ED DIAGNOSES   Final diagnoses:  Periorbital hematoma of right eye  Minor head injury, initial encounter     Rx / DC Orders   ED Discharge Orders     None        Note:  This document was prepared using Dragon voice recognition software and may include unintentional dictation errors.   Delton Prairie, MD 10/09/23 [phone]

## 2023-10-09 NOTE — ED Notes (Signed)
 Called pt's group home administrator, Babette Relic, at [phone] to notify of discharge and request pickup. Tammy states she will be here shortly.

## 2023-10-09 NOTE — ED Triage Notes (Signed)
 Pt here with a right eye injury from a fall 2 days ago. Pt states he was dizzy and then fell but got back up. Pt states he can still see out of his eye, eye is red and slightly bruised.

## 2023-10-26 IMAGING — CR DG CHEST 2V
1 series · 2 of 2 positions shown · non-contrast
Comparison: Chest CT 06/07/2020. Prior chest radiographs 05/26/2018
and earlier.

CLINICAL DATA: Provided history: Weakness, dizziness.

EXAM:
CHEST - 2 VIEW

[Series 1: dg chest 2 view · 0.14mm/px · 2 of 2 slices shown]
[im 1/2]
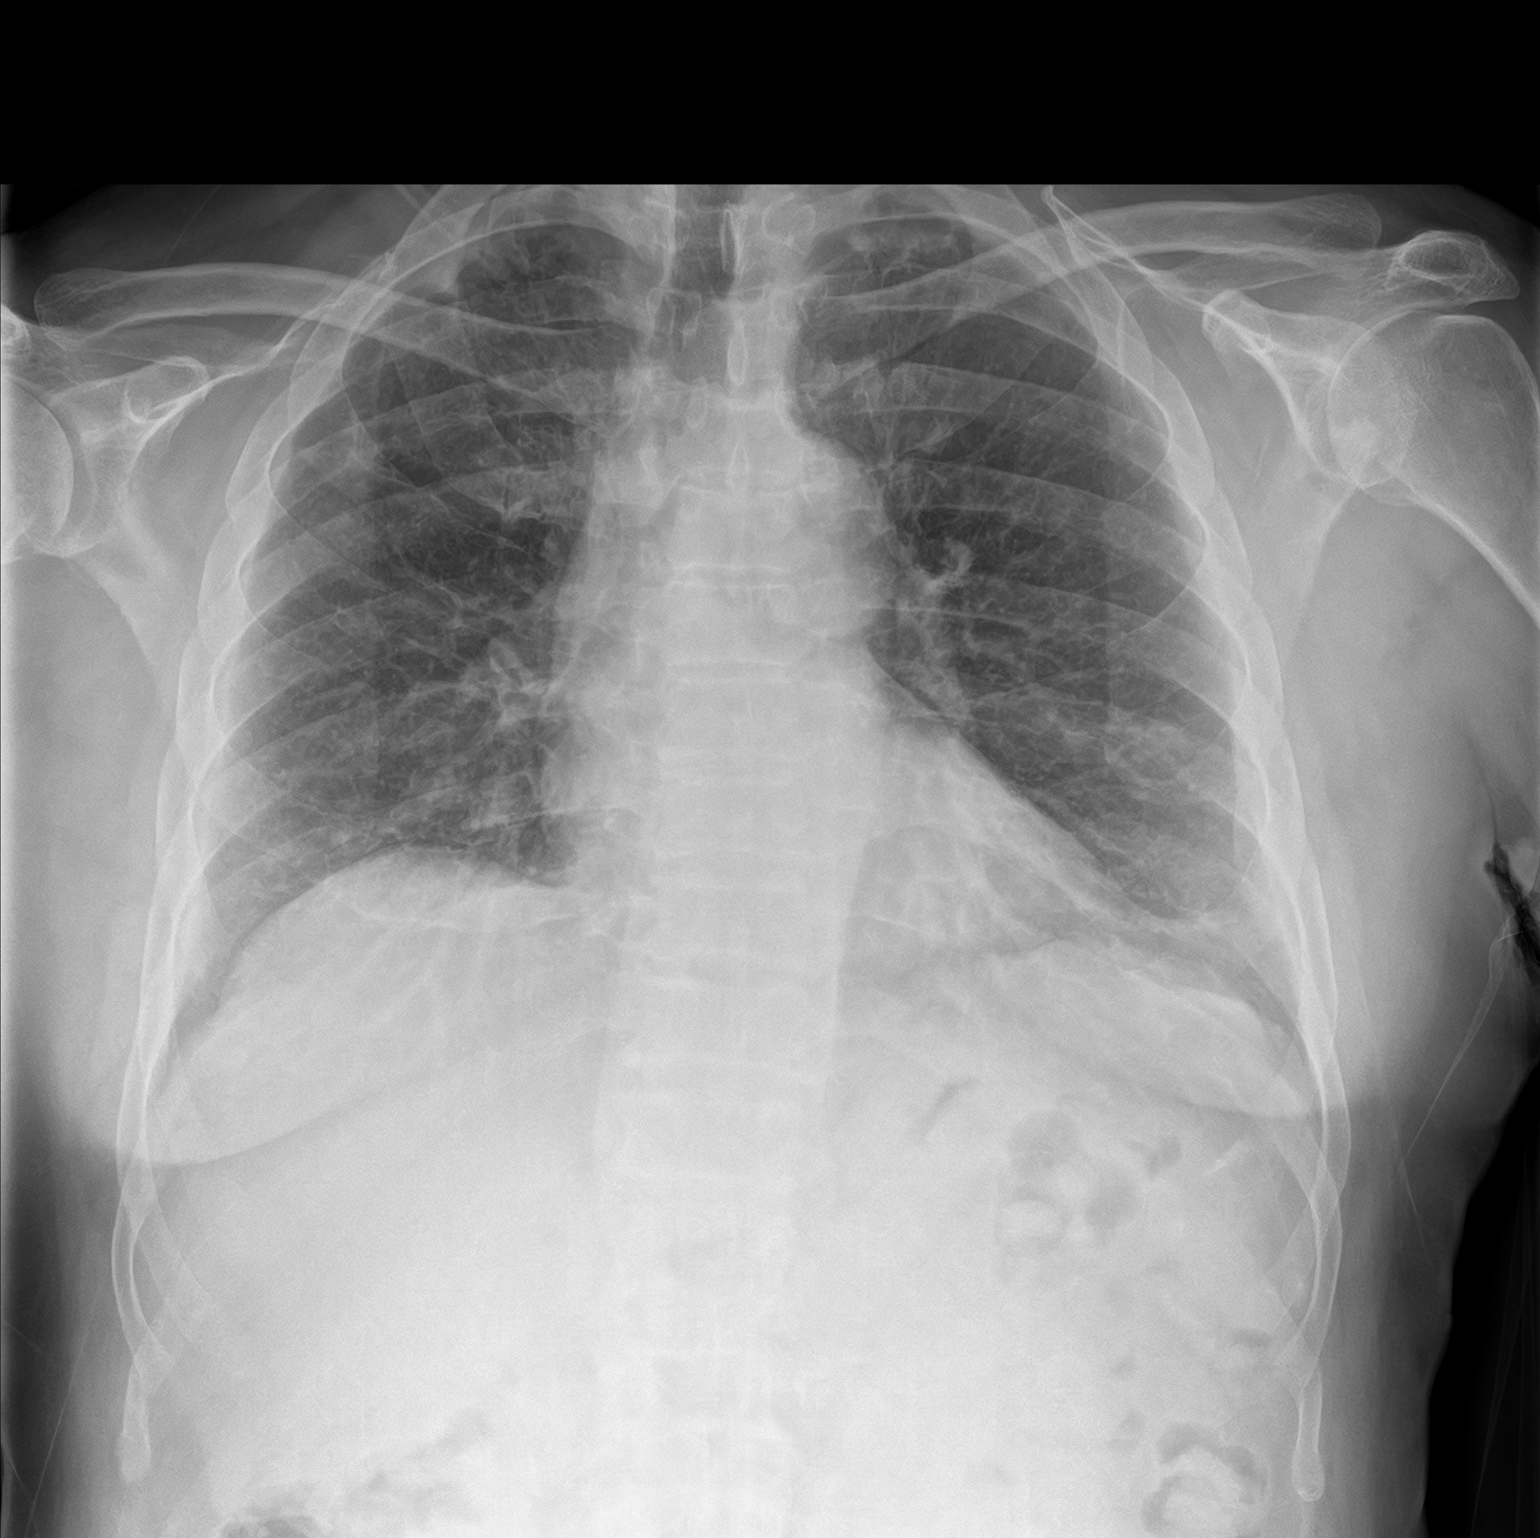
[im 2/2]
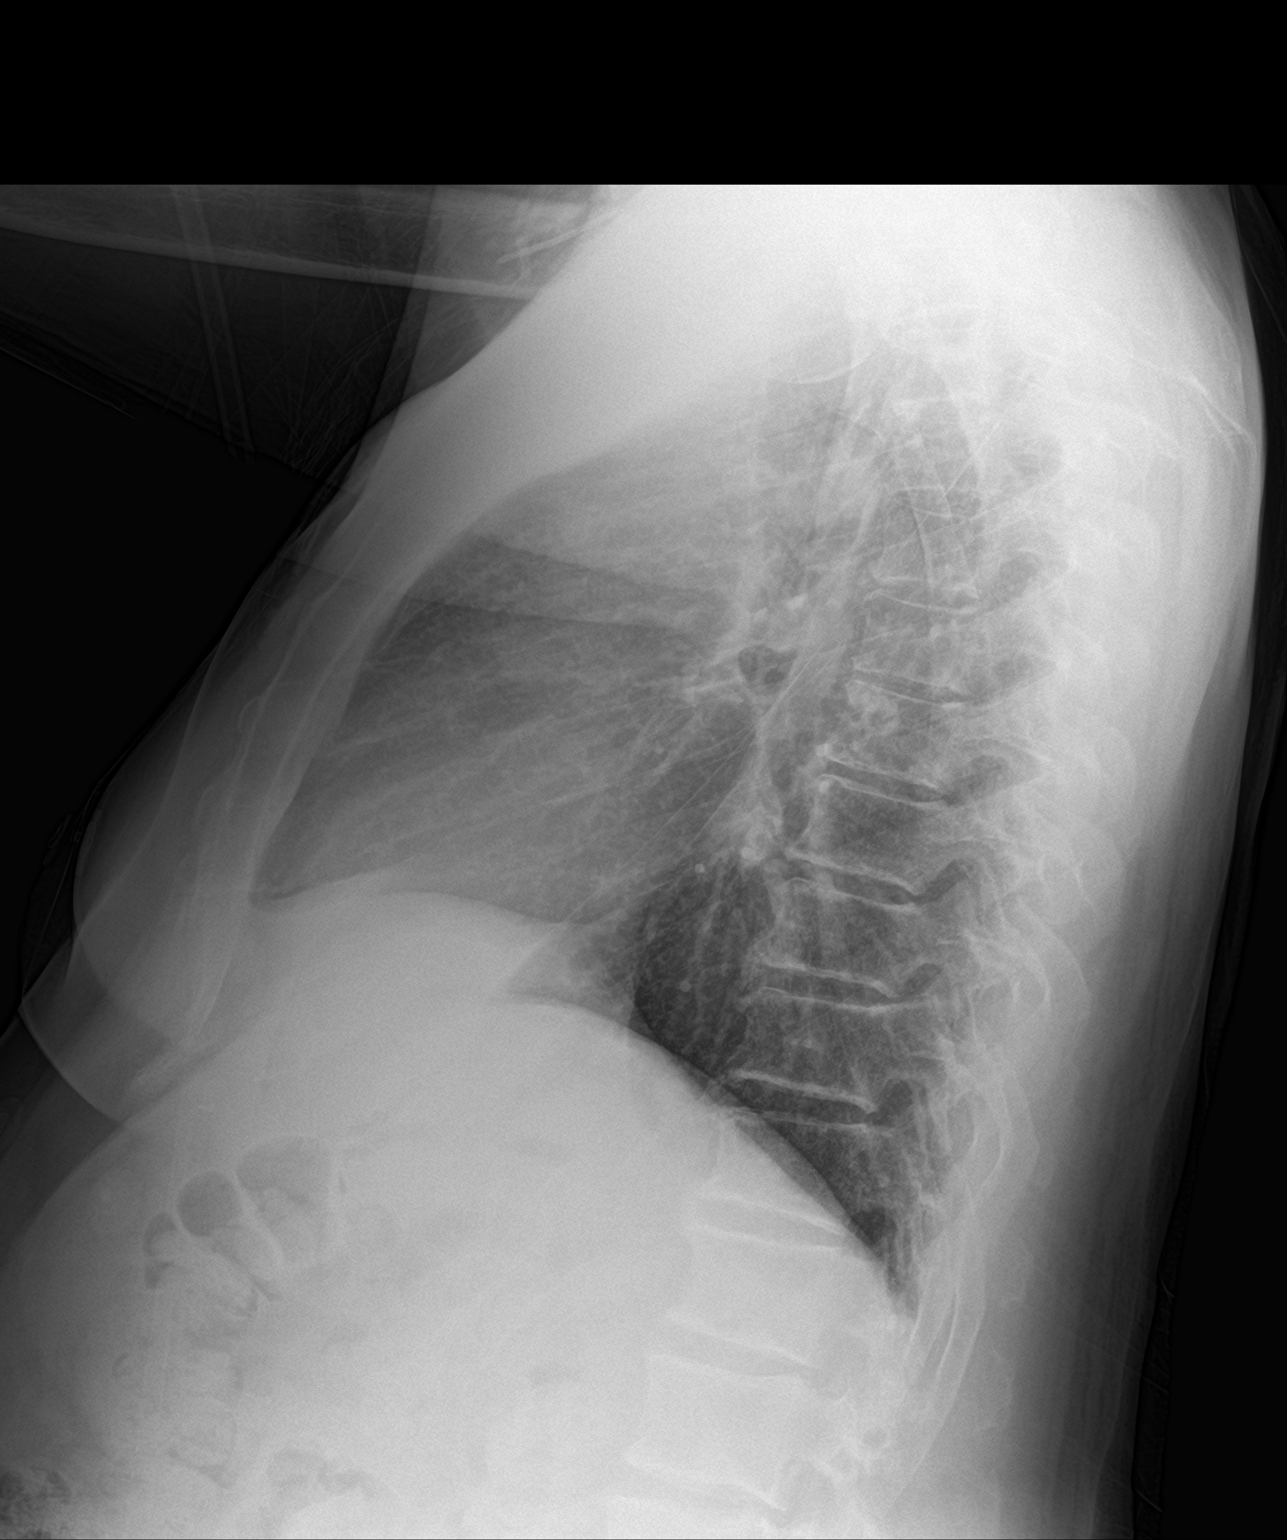

[2 of 2 positions shown; findings below may reference images not displayed]

FINDINGS: Heart size within normal limits. Aortic atherosclerosis.
Redemonstrated bilateral upper lobe pleuroparenchymal scarring. Mild
ill-defined opacities within the left mid to lower lung field. No
appreciable airspace consolidation within the right lung. No
evidence of pleural effusion or pneumothorax. No acute bony
abnormality identified. Thoracic levocurvature.
IMPRESSION: Mild ill-defined opacities within the left mid-to-lower lung field.
These foci have an appearance most suggestive of atelectasis.
However, pneumonia cannot be excluded. Correlate clinically and
consider short-interval radiographic follow-up.

Redemonstrated bilateral upper lobe pleuroparenchymal scarring.

Aortic Atherosclerosis ([id number]).

## 2024-03-02 ENCOUNTER — Emergency Department: Admission: EM | Admit: 2024-03-02 | Discharge: 2024-03-02 | Disposition: A | Discharge status: Home / Self Care

## 2024-03-02 ENCOUNTER — Emergency Department

## 2024-03-02 DIAGNOSIS — R531 Weakness: Secondary | ICD-10-CM | POA: Diagnosis present

## 2024-03-02 LAB — CBC
HCT: 41 % (ref 39.0–52.0)
Hemoglobin: 13.7 g/dL (ref 13.0–17.0)
MCH: 31.2 pg (ref 26.0–34.0)
MCHC: 33.4 g/dL (ref 30.0–36.0)
MCV: 93.4 fL (ref 80.0–100.0)
Platelets: 242 K/uL (ref 150–400)
RBC: 4.39 MIL/uL (ref 4.22–5.81)
RDW: 13.3 % (ref 11.5–15.5)
WBC: 9.8 K/uL (ref 4.0–10.5)
nRBC: 0 % (ref 0.0–0.2)

## 2024-03-02 LAB — COMPREHENSIVE METABOLIC PANEL WITH GFR
ALT: 16 U/L (ref 0–44)
AST: 21 U/L (ref 15–41)
Albumin: 4.1 g/dL (ref 3.5–5.0)
Alkaline Phosphatase: 69 U/L (ref 38–126)
Anion gap: 10 (ref 5–15)
BUN: 12 mg/dL (ref 8–23)
CO2: 25 mmol/L (ref 22–32)
Calcium: 10.4 mg/dL — ABNORMAL HIGH (ref 8.9–10.3)
Chloride: 107 mmol/L (ref 98–111)
Creatinine, Ser: 1.12 mg/dL (ref 0.61–1.24)
GFR, Estimated: >60 mL/min (ref >60)
Glucose, Bld: 146 mg/dL — ABNORMAL HIGH (ref 70–99)
Potassium: 3.6 mmol/L (ref 3.5–5.1)
Sodium: 142 mmol/L (ref 135–145)
Total Bilirubin: 0.9 mg/dL (ref 0.0–1.2)
Total Protein: 7 g/dL (ref 6.5–8.1)

## 2024-03-02 NOTE — ED Provider Notes (Signed)
-----------------------------------------   4:13 PM on 03/02/2024 -----------------------------------------  Blood pressure (!) 142/83, pulse 75, temperature 98.3 F (36.8 C), temperature source Oral, resp. rate 18, SpO2 98%.  Assuming care from Devere Perry, PA-C.  In short, Omar Patterson is a 73 y.o. male with a chief complaint of No chief complaint on file. SABRA  Refer to the original H&P for additional details.  The current plan of care is to await CT head scan.  ----------------------------------------- 4:13 PM on 03/02/2024 ----------------------------------------- CT scan results updated patient.  Normal findings.  Given negative CT will discharge home.       Margrette Monte A, PA-C 03/02/24 1614    Clarine Ozell DELENA, MD 03/03/24 2351

## 2024-03-02 NOTE — ED Triage Notes (Signed)
 Pt to ED from group home with generalized weakness for the last 3 days per staff, negative stroke screen. Pt has no complaints at this time. Hx IDD. A&O x3 at baseline per EMS.

## 2024-03-02 NOTE — ED Notes (Signed)
 Report given to Tim at group home who is sending a staff member to pick up the patient. Patient is aware.

## 2024-03-02 NOTE — ED Notes (Signed)
 Patient taken to CT scan.

## 2024-03-02 NOTE — ED Notes (Addendum)
 Tim from group home states patient has been off-balance, sleeping a lot and having problems swallowing pills. Patient is able to drink without difficulty. PA-C aware. Bed alarm is on.

## 2024-03-02 NOTE — ED Notes (Signed)
 Patient c/o mid-abdominal pain, but is unable to give a number, just states a little bit. Patient denies constipation/diarrhea or nausea. Patient states his name is Omar Patterson.

## 2024-03-02 NOTE — ED Triage Notes (Signed)
 Pt to ED via [facility] from [facility]. Facility reports pt has been off x3 days. EMS stroke screen negative  125/94 98% RA 98.1 168 CBG  HR 75

## 2024-03-02 NOTE — ED Provider Notes (Signed)
 [facility] Provider Note    Event Date/Time   First MD Initiated Contact with Patient 03/02/24 1421     (approximate)   History   No chief complaint on file.   HPI  Omar Patterson is a 73 y.o. male history of bipolar, schizoaffective disorder, hyponatremia, osteoporosis and hyperlipidemia presents emergency department via EMS from a group home.  Group home is concerned he has been having mini strokes as he woke up in someone else's bed today.  They state he was not in his regular bed was in someone else's bed.      Physical Exam   Triage Vital Signs: ED Triage Vitals [03/02/24 1318]  Encounter Vitals Group     BP (!) 142/83     Girls Systolic BP Percentile      Girls Diastolic BP Percentile      Boys Systolic BP Percentile      Boys Diastolic BP Percentile      Pulse Rate 75     Resp 18     Temp 98.3 F (36.8 C)     Temp Source Oral     SpO2 98 %     Weight      Height      Head Circumference      Peak Flow      Pain Score 0     Pain Loc      Pain Education      Exclude from Growth Chart     Most recent vital signs: Vitals:   03/02/24 1318  BP: (!) 142/83  Pulse: 75  Resp: 18  Temp: 98.3 F (36.8 C)  SpO2: 98%     General: Awake, no distress.   CV:  Good peripheral perfusion. regular rate and  rhythm Resp:  Normal effort. Lungs CTA Abd:  No distention.   Other:  Patient is alert, walking around the ER does not appear to have any confusion or weakness   ED Results / Procedures / Treatments   Labs (all labs ordered are listed, but only abnormal results are displayed) Labs Reviewed  COMPREHENSIVE METABOLIC PANEL WITH GFR - Abnormal; Notable for the following components:      Result Value   Glucose, Bld 146 (*)    Calcium 10.4 (*)    All other components within normal limits  CBC     EKG     RADIOLOGY CT of the head    PROCEDURES:   Procedures  Critical Care:  no No chief complaint on  file.     MEDICATIONS ORDERED IN ED: Medications - No data to display   IMPRESSION / MDM / ASSESSMENT AND PLAN / ED COURSE  I reviewed the triage vital signs and the nursing notes.                              Differential diagnosis includes, but is not limited to, CVA, subdural, SAH, electrolyte imbalance  Patient's presentation is most consistent with acute illness / injury with system symptoms.   Cardiac monitor no Medications given: None  Labs are reassuring  The patient has been alert, talkative, walking around the ER as if nothing is wrong.  However his group home is concerned that he is having mini strokes.  Will go ahead do CT of the head.  If negative will discharge to home.  Care is being transferred to [facility], PA-C  FINAL CLINICAL IMPRESSION(S) / ED DIAGNOSES   Final diagnoses:  Weakness     Rx / DC Orders   ED Discharge Orders     None        Note:  This document was prepared using Dragon voice recognition software and may include unintentional dictation errors.    Gasper Devere ORN, PA-C 03/02/24 1527    Ernest Ronal BRAVO, MD 03/03/24 [phone]

## 2024-03-02 NOTE — Discharge Instructions (Addendum)
 Your evaluated in the ED for weakness.  Your lab work is reassuring.  Your head CT is normal and revealed no acute life-threatening injuries or illnesses.  Please follow-up with your primary care provider.  Get plenty of rest and stay hydrated.

## 2024-03-02 NOTE — ED Notes (Addendum)
 Pt ambulated to bathroom at this time. No assist needed.Pt back in bed with call light near and bed alarm on at this time.

## 2024-04-10 ENCOUNTER — Emergency Department
Admission: EM | Admit: 2024-04-10 | Discharge: 2024-04-10 | Disposition: A | Discharge status: Home / Self Care | Attending: Emergency Medicine | Admitting: Emergency Medicine

## 2024-04-10 ENCOUNTER — Emergency Department

## 2024-04-10 DIAGNOSIS — K5900 Constipation, unspecified: Secondary | ICD-10-CM | POA: Insufficient documentation

## 2024-04-10 DIAGNOSIS — J449 Chronic obstructive pulmonary disease, unspecified: Secondary | ICD-10-CM | POA: Diagnosis not present

## 2024-04-10 DIAGNOSIS — R110 Nausea: Secondary | ICD-10-CM

## 2024-04-10 DIAGNOSIS — R1084 Generalized abdominal pain: Secondary | ICD-10-CM | POA: Diagnosis present

## 2024-04-10 DIAGNOSIS — D72829 Elevated white blood cell count, unspecified: Secondary | ICD-10-CM | POA: Diagnosis not present

## 2024-04-10 LAB — CBC WITH DIFFERENTIAL/PLATELET
Abs Immature Granulocytes: 0.03 K/uL (ref 0.00–0.07)
Basophils Absolute: 0.1 K/uL (ref 0.0–0.1)
Basophils Relative: 1 %
Eosinophils Absolute: 0.2 K/uL (ref 0.0–0.5)
Eosinophils Relative: 2 %
HCT: 40.9 % (ref 39.0–52.0)
Hemoglobin: 13.9 g/dL (ref 13.0–17.0)
Immature Granulocytes: 0 %
Lymphocytes Relative: 22 %
Lymphs Abs: 2.4 K/uL (ref 0.7–4.0)
MCH: 31.9 pg (ref 26.0–34.0)
MCHC: 34 g/dL (ref 30.0–36.0)
MCV: 93.8 fL (ref 80.0–100.0)
Monocytes Absolute: 0.8 K/uL (ref 0.1–1.0)
Monocytes Relative: 7 %
Neutro Abs: 7.5 K/uL (ref 1.7–7.7)
Neutrophils Relative %: 68 %
Platelets: 264 K/uL (ref 150–400)
RBC: 4.36 MIL/uL (ref 4.22–5.81)
RDW: 13.4 % (ref 11.5–15.5)
WBC: 11 K/uL — ABNORMAL HIGH (ref 4.0–10.5)
nRBC: 0 % (ref 0.0–0.2)

## 2024-04-10 LAB — COMPREHENSIVE METABOLIC PANEL WITH GFR
ALT: 15 U/L (ref 0–44)
AST: 19 U/L (ref 15–41)
Albumin: 4.1 g/dL (ref 3.5–5.0)
Alkaline Phosphatase: 57 U/L (ref 38–126)
Anion gap: 7 (ref 5–15)
BUN: 11 mg/dL (ref 8–23)
CO2: 29 mmol/L (ref 22–32)
Calcium: 10.5 mg/dL — ABNORMAL HIGH (ref 8.9–10.3)
Chloride: 102 mmol/L (ref 98–111)
Creatinine, Ser: 1.02 mg/dL (ref 0.61–1.24)
GFR, Estimated: >60 mL/min (ref >60)
Glucose, Bld: 105 mg/dL — ABNORMAL HIGH (ref 70–99)
Potassium: 3.7 mmol/L (ref 3.5–5.1)
Sodium: 138 mmol/L (ref 135–145)
Total Bilirubin: 1.1 mg/dL (ref 0.0–1.2)
Total Protein: 6.9 g/dL (ref 6.5–8.1)

## 2024-04-10 LAB — TROPONIN I (HIGH SENSITIVITY)
Troponin I (High Sensitivity): 5 ng/L (ref <18)
Troponin I (High Sensitivity): 5 ng/L (ref <18)

## 2024-04-10 LAB — URINALYSIS, ROUTINE W REFLEX MICROSCOPIC
Bilirubin Urine: NEGATIVE
Glucose, UA: NEGATIVE mg/dL
Hgb urine dipstick: NEGATIVE
Ketones, ur: 5 mg/dL — AB
Leukocytes,Ua: NEGATIVE
Nitrite: NEGATIVE
Protein, ur: NEGATIVE mg/dL
Specific Gravity, Urine: 1.04 — ABNORMAL HIGH (ref 1.005–1.030)
pH: 7 (ref 5.0–8.0)

## 2024-04-10 LAB — MAGNESIUM: Magnesium: 1.7 mg/dL (ref 1.7–2.4)

## 2024-04-10 LAB — LIPASE, BLOOD: Lipase: 25 U/L (ref 11–51)

## 2024-04-10 MED ORDER — POLYETHYLENE GLYCOL 3350 17 G PO PACK
34.0000 g | PACK | Freq: Once | ORAL | Status: AC
  Administered 2024-04-10: 34 g via ORAL
  Filled 2024-04-10: qty 2

## 2024-04-10 MED ORDER — ONDANSETRON 4 MG PO TBDP: 4.0000 mg | ORAL_TABLET | Freq: Three times a day (TID) | ORAL | 0 refills | Status: DC | PRN

## 2024-04-10 MED ORDER — MORPHINE SULFATE (PF) 4 MG/ML IV SOLN
4.0000 mg | Freq: Once | INTRAVENOUS | Status: AC
  Administered 2024-04-10: 4 mg via INTRAVENOUS
  Filled 2024-04-10: qty 1

## 2024-04-10 MED ORDER — ONDANSETRON HCL 4 MG/2ML IJ SOLN
4.0000 mg | Freq: Once | INTRAMUSCULAR | Status: AC
  Administered 2024-04-10: 4 mg via INTRAVENOUS
  Filled 2024-04-10: qty 2

## 2024-04-10 MED ORDER — LACTATED RINGERS IV BOLUS
1000.0000 mL | Freq: Once | INTRAVENOUS | Status: AC
  Administered 2024-04-10: 1000 mL via INTRAVENOUS

## 2024-04-10 MED ORDER — IOHEXOL 300 MG/ML  SOLN
100.0000 mL | Freq: Once | INTRAMUSCULAR | Status: AC | PRN
  Administered 2024-04-10: 100 mL via INTRAVENOUS

## 2024-04-10 NOTE — Discharge Instructions (Signed)
 Signs of constipation. Use Miralax  1-2 times per day  Zofran  as needed for nausea or vomiting.   Return to the ED with any worsening symptoms.

## 2024-04-10 NOTE — ED Triage Notes (Signed)
 Pt to ED from group home with upper abdominal/bilateral jaw pain and nausea since yesterday. Abdomen tender to touch, denies CP/SOB.

## 2024-04-10 NOTE — ED Provider Notes (Signed)
 [facility] Provider Note    Event Date/Time   First MD Initiated Contact with Patient 04/10/24 [phone]     (approximate)   History   Abdominal Pain and Nausea   HPI  Omar Patterson is a 73 y.o. male who presents to the ED for evaluation of Abdominal Pain and Nausea   Patient with history of schizoaffective disorder, bipolar disorder and COPD, resides at a local group home.  Patient presents to the ED from local group home via EMS for evaluation of upper abdominal pain, nausea and jaw weakness.  History is somewhat limited and quite inconsistent from the patient.  He initially tells me that he has been feeling weak and has bilateral jaw weakness or maybe my teeth are weak, for the past 2 months, but he tells triage nurses he had 2 days of upper abdominal pain.  Denies any chest pain.   Physical Exam   Triage Vital Signs: ED Triage Vitals  Encounter Vitals Group     BP      Girls Systolic BP Percentile      Girls Diastolic BP Percentile      Boys Systolic BP Percentile      Boys Diastolic BP Percentile      Pulse      Resp      Temp      Temp src      SpO2      Weight      Height      Head Circumference      Peak Flow      Pain Score      Pain Loc      Pain Education      Exclude from Growth Chart     Most recent vital signs: Vitals:   04/10/24 0836 04/10/24 1312  BP:  (!) 135/107  Pulse:  (!) 55  Resp:  14  Temp: 98.5 F (36.9 C)   SpO2:  100%    General: Awake, no distress.  CV:  Good peripheral perfusion.  Resp:  Normal effort.  Abd:  No distention.  Mild upper abdominal tenderness, more so to the RUQ than LUQ.  Benign lower abdomen. MSK:  No deformity noted.  Neuro:  No focal deficits appreciated. Other:  Poor dentition but no signs of upper airway obstruction, periodontal abscess, swelling   ED Results / Procedures / Treatments   Labs (all labs ordered are listed, but only abnormal results are displayed) Labs Reviewed   COMPREHENSIVE METABOLIC PANEL WITH GFR - Abnormal; Notable for the following components:      Result Value   Glucose, Bld 105 (*)    Calcium 10.5 (*)    All other components within normal limits  CBC WITH DIFFERENTIAL/PLATELET - Abnormal; Notable for the following components:   WBC 11.0 (*)    All other components within normal limits  URINALYSIS, ROUTINE W REFLEX MICROSCOPIC - Abnormal; Notable for the following components:   Color, Urine STRAW (*)    APPearance CLEAR (*)    Specific Gravity, Urine 1.040 (*)    Ketones, ur 5 (*)    All other components within normal limits  LIPASE, BLOOD  MAGNESIUM  TROPONIN I (HIGH SENSITIVITY)  TROPONIN I (HIGH SENSITIVITY)    EKG Sinus rhythm with a rate of 69 bpm.  Normal axis and intervals, abnormal R wave progression with early transition, no clear signs of acute ischemia.  RADIOLOGY CT abdomen/pelvis interpreted by me without evidence of  acute pathology.  Constipation noted  Official radiology report(s): CT ABDOMEN PELVIS W CONTRAST Result Date: 04/10/2024 CLINICAL DATA:  Upper abdominal pain with nausea. EXAM: CT ABDOMEN AND PELVIS WITH CONTRAST TECHNIQUE: Multidetector CT imaging of the abdomen and pelvis was performed using the standard protocol following bolus administration of intravenous contrast. RADIATION DOSE REDUCTION: This exam was performed according to the departmental dose-optimization program which includes automated exposure control, adjustment of the mA and/or kV according to patient size and/or use of iterative reconstruction technique. CONTRAST:  100mL OMNIPAQUE  IOHEXOL  300 MG/ML  SOLN COMPARISON:  None Available. FINDINGS: Lower chest: No acute findings. Hepatobiliary: No suspicious focal abnormality within the liver parenchyma. There is no evidence for gallstones, gallbladder wall thickening, or pericholecystic fluid. No intrahepatic or extrahepatic biliary dilation. Pancreas: No focal mass lesion. No dilatation of the main  duct. No intraparenchymal cyst. No peripancreatic edema. Spleen: No splenomegaly. No suspicious focal mass lesion. Adrenals/Urinary Tract: No adrenal nodule or mass. Insert right kidney Tiny well-defined homogeneous low-density lesion in the left kidney is too small to characterize but is statistically most likely benign and probably a cyst. No followup imaging is recommended. No evidence for hydroureter. The urinary bladder appears normal for the degree of distention. Stomach/Bowel: Stomach is unremarkable. No gastric wall thickening. No evidence of outlet obstruction. Duodenum is normally positioned as is the ligament of Treitz. No small bowel wall thickening. No small bowel dilatation. The terminal ileum is normal. The appendix is normal. Moderate stool volume throughout the colon. Vascular/Lymphatic: There is mild atherosclerotic calcification of the abdominal aorta without aneurysm. There is no gastrohepatic or hepatoduodenal ligament lymphadenopathy. No retroperitoneal or mesenteric lymphadenopathy. No pelvic sidewall lymphadenopathy. Reproductive: The prostate gland and seminal vesicles are unremarkable. Other: No intraperitoneal free fluid. Musculoskeletal: Small bilateral groin hernias contain only fat. Small fat containing umbilical hernia evident. No worrisome lytic or sclerotic osseous abnormality. IMPRESSION: 1. No acute findings in the abdomen or pelvis. Specifically, no findings to explain the patient's history of upper abdominal pain and nausea. 2. Moderate stool volume. Imaging features could be compatible with clinical constipation. 3. Small bilateral groin hernias contain only fat. 4.  Aortic Atherosclerosis (ICD10-I70.0). Electronically Signed   By: Camellia Candle M.D.   On: 04/10/2024 10:10   DG Chest 2 View Result Date: 04/10/2024 CLINICAL DATA:  Upper abdominal pain.  Chest pain. EXAM: CHEST - 2 VIEW COMPARISON:  01/01/2022 FINDINGS: Low volume film. Cardiopericardial silhouette is at upper  limits of normal for size. The lungs are clear without focal pneumonia, edema, pneumothorax or pleural effusion. No acute bony abnormality. Telemetry leads overlie the chest. IMPRESSION: Low volume film without acute cardiopulmonary findings. Electronically Signed   By: Camellia Candle M.D.   On: 04/10/2024 09:58    PROCEDURES and INTERVENTIONS:  .1-3 Lead EKG Interpretation  Performed by: Claudene Rover, MD Authorized by: Claudene Rover, MD     Interpretation: normal     ECG rate:  70   ECG rate assessment: normal     Rhythm: sinus rhythm     Ectopy: none     Conduction: normal     Medications  lactated ringers  bolus 1,000 mL (0 mLs Intravenous Stopped 04/10/24 1142)  ondansetron  (ZOFRAN ) injection 4 mg (4 mg Intravenous Given 04/10/24 0846)  morphine  (PF) 4 MG/ML injection 4 mg (4 mg Intravenous Given 04/10/24 0846)  iohexol  (OMNIPAQUE ) 300 MG/ML solution 100 mL (100 mLs Intravenous Contrast Given 04/10/24 0934)  polyethylene glycol (MIRALAX  / GLYCOLAX ) packet 34 g (34 g  Oral Given 04/10/24 1030)     IMPRESSION / MDM / ASSESSMENT AND PLAN / ED COURSE  I reviewed the triage vital signs and the nursing notes.  Differential diagnosis includes, but is not limited to, ACS, PTX, PNA, muscle strain/spasm, PE, dissection, anxiety, pleural effusion, GERD, gastritis, biliary colic  {Patient presents with symptoms of an acute illness or injury that is potentially life-threatening.  Patient presents to the ED with abdominal discomfort, possibly related to constipation, with a benign workup and suitable for outpatient management.  Difficult to get an accurate history as he is quite inconsistent.   Normal CBC, metabolic panel, lipase, troponins.  UA with small ketones, no infectious features.  Reassuring imaging.  Possibly related to constipation so we will start him on MiraLAX  here in the ED, suitable for outpatient management.  Clinical Course as of 04/10/24 1516  Sat Apr 10, 2024  1017 Reassessed.   Patient has no complaints.  Reexamined, benign abdomen.  Discussed plan of care. [DS]    Clinical Course User Index [DS] Claudene Rover, MD     FINAL CLINICAL IMPRESSION(S) / ED DIAGNOSES   Final diagnoses:  Nausea  Generalized abdominal pain  Constipation, unspecified constipation type     Rx / DC Orders   ED Discharge Orders          Ordered    ondansetron  (ZOFRAN -ODT) 4 MG disintegrating tablet  Every 8 hours PRN        04/10/24 1331             Note:  This document was prepared using Dragon voice recognition software and may include unintentional dictation errors.   Claudene Rover, MD 04/10/24 [phone]

## 2024-04-23 ENCOUNTER — Other Ambulatory Visit: Payer: Self-pay

## 2024-04-23 ENCOUNTER — Emergency Department

## 2024-04-23 ENCOUNTER — Emergency Department: Admission: EM | Admit: 2024-04-23 | Discharge: 2024-04-23 | Disposition: A | Discharge status: Home / Self Care

## 2024-04-23 DIAGNOSIS — R1310 Dysphagia, unspecified: Secondary | ICD-10-CM | POA: Diagnosis not present

## 2024-04-23 DIAGNOSIS — R0602 Shortness of breath: Secondary | ICD-10-CM | POA: Insufficient documentation

## 2024-04-23 DIAGNOSIS — I700 Atherosclerosis of aorta: Secondary | ICD-10-CM | POA: Diagnosis not present

## 2024-04-23 DIAGNOSIS — K5900 Constipation, unspecified: Secondary | ICD-10-CM | POA: Insufficient documentation

## 2024-04-23 DIAGNOSIS — J449 Chronic obstructive pulmonary disease, unspecified: Secondary | ICD-10-CM | POA: Insufficient documentation

## 2024-04-23 DIAGNOSIS — R1110 Vomiting, unspecified: Secondary | ICD-10-CM | POA: Diagnosis present

## 2024-04-23 DIAGNOSIS — R1013 Epigastric pain: Secondary | ICD-10-CM | POA: Diagnosis not present

## 2024-04-23 DIAGNOSIS — R109 Unspecified abdominal pain: Secondary | ICD-10-CM

## 2024-04-23 DIAGNOSIS — R4182 Altered mental status, unspecified: Secondary | ICD-10-CM | POA: Diagnosis present

## 2024-04-23 DIAGNOSIS — R079 Chest pain, unspecified: Secondary | ICD-10-CM | POA: Diagnosis not present

## 2024-04-23 LAB — CBC
HCT: 43.5 % (ref 39.0–52.0)
Hemoglobin: 14.4 g/dL (ref 13.0–17.0)
MCH: 31.7 pg (ref 26.0–34.0)
MCHC: 33.1 g/dL (ref 30.0–36.0)
MCV: 95.8 fL (ref 80.0–100.0)
Platelets: 241 K/uL (ref 150–400)
RBC: 4.54 MIL/uL (ref 4.22–5.81)
RDW: 13.6 % (ref 11.5–15.5)
WBC: 10.9 K/uL — ABNORMAL HIGH (ref 4.0–10.5)
nRBC: 0 % (ref 0.0–0.2)

## 2024-04-23 LAB — BASIC METABOLIC PANEL WITH GFR
Anion gap: 8 (ref 5–15)
BUN: 13 mg/dL (ref 8–23)
CO2: 22 mmol/L (ref 22–32)
Calcium: 9.8 mg/dL (ref 8.9–10.3)
Chloride: 105 mmol/L (ref 98–111)
Creatinine, Ser: 1.15 mg/dL (ref 0.61–1.24)
GFR, Estimated: >60 mL/min (ref >60)
Glucose, Bld: 98 mg/dL (ref 70–99)
Potassium: 4 mmol/L (ref 3.5–5.1)
Sodium: 135 mmol/L (ref 135–145)

## 2024-04-23 LAB — TROPONIN I (HIGH SENSITIVITY)
Troponin I (High Sensitivity): 3 ng/L (ref <18)
Troponin I (High Sensitivity): 4 ng/L (ref <18)

## 2024-04-23 MED ORDER — FLEET ENEMA RE ENEM: 1.0000 | ENEMA | Freq: Every day | RECTAL | 0 refills | Status: AC | PRN

## 2024-04-23 MED ORDER — MILK OF MAGNESIA 7.75 % PO SUSP: 30.0000 mL | Freq: Every day | ORAL | 0 refills | Status: AC | PRN

## 2024-04-23 MED ORDER — POLYETHYLENE GLYCOL 3350 17 G PO PACK: 17.0000 g | PACK | Freq: Every day | ORAL | 0 refills | Status: AC

## 2024-04-23 NOTE — ED Notes (Signed)
 Numbers to call when discharged    Group home rep number.  Viviann Brought  [phone]  Group Home  [facility]  [phone]

## 2024-04-23 NOTE — ED Notes (Signed)
 Spoke w/ Viviann Brought form group home. D/c instructions reviewed w/ verbalization of understanding. Mr. Brought will be transporting pt back to facility. He states he will call this RN when he arrives to ER.

## 2024-04-23 NOTE — ED Triage Notes (Addendum)
 Pt comes with c/o sob chest pain and trouble swallowing. Pt is from [facility]. Pt has not been himself per group home staff. Group home nurse also stated the pt hasn't had BM in 4weeks and trouble swallowing which is new. Staff having to do 1 pill a time. She is concerned for pt aspirating. . Pt states vomiting

## 2024-04-23 NOTE — Discharge Instructions (Addendum)
 You were seen in our emergency department with concern for aspiration, abdominal pain chest pain and constipation.  Workup including checking your heart on multiple occasions was reassuring.  You also had imaging of your esophagus and entire bowel which only demonstrated moderate constipation.  You do not appear to have evidence of aspiration at this time.  I have sent some medications to help with your constipation.  Please follow-up with your primary care physician as soon as possible.  Return if any acutely worsening symptoms or any other emergency. -- RETURN PRECAUTIONS & AFTERCARE: (ENGLISH) RETURN PRECAUTIONS: Return immediately to the emergency department or see/call your doctor if you feel worse, weak or have changes in speech or vision, are short of breath, have fever, vomiting, pain, bleeding or dark stool, trouble urinating or any new issues. Return here or see/call your doctor if not improving as expected for your suspected condition. FOLLOW-UP CARE: Call your doctor and/or any doctors we referred you to for more advice and to make an appointment. Do this today, tomorrow or after the weekend. Some doctors only take PPO insurance so if you have HMO insurance you may want to contact your HMO or your regular doctor for referral to a specialist within your plan. Either way tell the doctor's office that it was a referral from the emergency department so you get the soonest possible appointment.  YOUR TEST RESULTS: Take result reports of any blood or urine tests, imaging tests and EKG's to your doctor and any referral doctor. Have any abnormal tests repeated. Your doctor or a referral doctor can let you know when this should be done. Also make sure your doctor contacts this hospital to get any test results that are not currently available such as cultures or special tests for infection and final imaging reports, which are often not available at the time you leave the ER but which may list additional  important findings that are not documented on the preliminary report. BLOOD PRESSURE: If your blood pressure was greater than 120/80 have your blood pressure rechecked within 1 to 2 weeks. MEDICATION SIDE EFFECTS: Do not drive, walk, bike, take the bus, etc. if you have received or are being prescribed any sedating medications such as those for pain or anxiety or certain antihistamines like Benadryl. If you have been give one of these here get a taxi home or have a friend drive you home. Ask your pharmacist to counsel you on potential side effects of any new medication

## 2024-04-23 NOTE — ED Provider Notes (Signed)
 [facility] Provider Note    Event Date/Time   First MD Initiated Contact with Patient 04/23/24 1640     (approximate)   History   Shortness of Breath   HPI  Omar Patterson is a 73 y.o. male atient with history of schizoaffective disorder, bipolar disorder and COPD, resides at a local group home with several weeks of constipation, chest pain and concern for difficulty swallowing foodstuffs with aspiration.  Patient denies any fevers or chills.  Denies any neck pain, denies any chest pain currently but states that he has epigastric abdominal pain.  Reports that his last bowel movement was a week and a half ago and was fully formed.  Denies any pain with urination.  No SI HI or AVH S      Physical Exam   Triage Vital Signs: ED Triage Vitals  Encounter Vitals Group     BP 04/23/24 1440 98/82     Girls Systolic BP Percentile --      Girls Diastolic BP Percentile --      Boys Systolic BP Percentile --      Boys Diastolic BP Percentile --      Pulse Rate 04/23/24 1440 89     Resp 04/23/24 1440 18     Temp 04/23/24 1440 98 F (36.7 C)     Temp src --      SpO2 04/23/24 1440 99 %     Weight --      Height --      Head Circumference --      Peak Flow --      Pain Score 04/23/24 1438 5     Pain Loc --      Pain Education --      Exclude from Growth Chart --     Most recent vital signs: Vitals:   04/23/24 1915 04/23/24 1916  BP: (!) 146/84   Pulse: 91   Resp: 18   Temp: 98.9 F (37.2 C)   SpO2: 99% 99%    Nursing Triage Note reviewed. Vital signs reviewed and patients oxygen saturation is normoxic  General: Patient is well nourished, well developed, awake and alert, resting comfortably in no acute distress Head: Normocephalic and atraumatic Eyes: Normal inspection, extraocular muscles intact, no conjunctival pallor Ear, nose, throat: Normal external exam Neck: Normal range of motion Respiratory: Patient is in no respiratory distress, lungs  CTAB Cardiovascular: Patient is not tachycardic, RRR without murmur appreciated GI: Abd Soft, very mild ttp with no guarding or rebound , no CVA tenderness to palpation Back: Normal inspection of the back with good strength and range of motion throughout all ext Extremities: pulses intact with good cap refills, no LE pitting edema or calf tenderness Neuro: The patient is alert and oriented to person, place, and time, appropriately conversive, with 5/5 bilat UE/LE strength, no gross motor or sensory defects noted. Coordination appears to be adequate. Skin: Warm, dry, and intact Psych: normal mood and affect, no SI or HI  ED Results / Procedures / Treatments   Labs (all labs ordered are listed, but only abnormal results are displayed) Labs Reviewed  CBC - Abnormal; Notable for the following components:      Result Value   WBC 10.9 (*)    All other components within normal limits  BASIC METABOLIC PANEL WITH GFR  TROPONIN I (HIGH SENSITIVITY)  TROPONIN I (HIGH SENSITIVITY)     EKG EKG and rhythm strip are interpreted by myself:  EKG: [Normal sinus rhythm] at heart rate of 87, normal QRS duration, QTc 421, nonormal ST segments and T waves no ectopy EKG not consistent with Acute STEMI Rhythm strip: NSR in lead II   RADIOLOGY CXR: No acute abnormality on my independent review and interpretation CT head without contrast: No acute abnormality on my independent review interpretation CT chest abdomen pelvis without contrast: There is moderate constipation but no evidence of obstruction or any other abnormality    PROCEDURES:  Critical Care performed: No  Procedures   MEDICATIONS ORDERED IN ED: Medications - No data to display   IMPRESSION / MDM / ASSESSMENT AND PLAN / ED COURSE                                Differential diagnosis includes, but is not limited to, pneumonia, aspiration, esophageal obstruction, bowel obstruction, constipation, ACS, acute renal  insufficiency  ED course: Patient is well-appearing and EKG demonstrates no evidence of acute ischemia.  His abdomen demonstrates no evidence of peritonitis.  2 troponins were not elevated.  He had no profound electrolyte derangements including no acute renal insufficiency.  Chest x-ray demonstrated no evidence of aspiration.  He had no difficulty taking the p.o. contrast for his CT chest abdomen pelvis which demonstrated no obstructions or esophageal abnormality and only moderate constipation.  Patient was able to take p.o. he will repeat abdominal exam was benign.  Will send the patient home with further stool regimen.  He was advised to follow-up as soon as possible with his primary care physician.  All questions answered and patient voiced understanding and requested discharge   Clinical Course as of 04/23/24 2018  Fri Apr 23, 2024  1652 Talked with radiology to determine what is the best study [HD]  1939 Troponin I (High Sensitivity): 3 No elevated troponin [HD]  1939 CBC(!) No leukocytosis no anemia [HD]  1939 Basic metabolic panel Electrolyte derangement [HD]  1939 CT CHEST ABDOMEN PELVIS WO CONTRAST Demonstrates constipation but no bowel obstruction or problems of esophagus [HD]  1948 Troponin I (High Sensitivity): 4 Not elevated [HD]  1948 Patient reassessed has tolerated p.o. without aspiration.  Reviewed return precautions and voiced understanding.  All questions answered and we will send the patient home with prescription for constipation [HD]    Clinical Course User Index [HD] Nicholaus Rolland BRAVO, MD   At time of discharge there is no evidence of acute life, limb, vision, or fertility threat. Patient has stable vital signs, pain is well controlled, patient is ambulatory and p.o. tolerant.  Discharge instructions were completed using the [employer] system. I would refer you to those at this time. All warnings prescriptions follow-up etc. were discussed in detail with the patient.  Patient indicates understanding and is agreeable with this plan. All questions answered.  Patient is made aware that they may return to the emergency department for any worsening or new condition or for any other emergency.    FINAL CLINICAL IMPRESSION(S) / ED DIAGNOSES   Final diagnoses:  Abdominal pain, unspecified abdominal location  Chest pain, unspecified type  Constipation, unspecified constipation type     Rx / DC Orders   ED Discharge Orders          Ordered    polyethylene glycol (MIRALAX ) 17 g packet  Daily        04/23/24 1947    magnesium hydroxide (MILK OF MAGNESIA) 400 MG/5ML suspension  Daily PRN  04/23/24 1947    sodium phosphate (FLEET) ENEM  Daily PRN        04/23/24 1947             Note:  This document was prepared using Dragon voice recognition software and may include unintentional dictation errors.   Nicholaus Rolland BRAVO, MD 04/23/24 2018

## 2024-04-23 NOTE — ED Notes (Signed)
 Attempted to call group home, no answer.  Attempted to call group home manager, tammy. No answer, VM left.

## 2024-05-15 ENCOUNTER — Emergency Department

## 2024-05-15 ENCOUNTER — Other Ambulatory Visit: Payer: Self-pay

## 2024-05-15 ENCOUNTER — Emergency Department
Admission: EM | Admit: 2024-05-15 | Discharge: 2024-05-15 | Disposition: A | Discharge status: Home / Self Care | Attending: Emergency Medicine | Admitting: Emergency Medicine

## 2024-05-15 DIAGNOSIS — W19XXXA Unspecified fall, initial encounter: Secondary | ICD-10-CM | POA: Diagnosis not present

## 2024-05-15 DIAGNOSIS — T148XXA Other injury of unspecified body region, initial encounter: Secondary | ICD-10-CM

## 2024-05-15 DIAGNOSIS — S61501A Unspecified open wound of right wrist, initial encounter: Secondary | ICD-10-CM | POA: Insufficient documentation

## 2024-05-15 DIAGNOSIS — Y92129 Unspecified place in nursing home as the place of occurrence of the external cause: Secondary | ICD-10-CM | POA: Diagnosis not present

## 2024-05-15 DIAGNOSIS — S8001XA Contusion of right knee, initial encounter: Secondary | ICD-10-CM | POA: Insufficient documentation

## 2024-05-15 DIAGNOSIS — S6991XA Unspecified injury of right wrist, hand and finger(s), initial encounter: Secondary | ICD-10-CM | POA: Diagnosis present

## 2024-05-15 DIAGNOSIS — Y9302 Activity, running: Secondary | ICD-10-CM | POA: Insufficient documentation

## 2024-05-15 DIAGNOSIS — S63501A Unspecified sprain of right wrist, initial encounter: Secondary | ICD-10-CM

## 2024-05-15 NOTE — ED Triage Notes (Signed)
 See first nurse note. From [facility] group home for mechanical fall. No LOC. Has abrasions/skin tears to R knee and R wrist. Pt denies pain.

## 2024-05-15 NOTE — Discharge Instructions (Signed)
 Keep wound as clean and dry as possible.  Apply a over-the-counter antibacterial ointment to the area once a day.  If it becomes infected please follow-up with his regular doctor.  Return emergency department if worsening

## 2024-05-15 NOTE — ED Provider Notes (Signed)
 [facility] Provider Note    Event Date/Time   First MD Initiated Contact with Patient 05/15/24 1454     (approximate)   History   Fall   HPI  Omar Patterson is a 73 y.o. male history of schizoaffective, bipolar, osteoporosis presents emergency department from a group home.  Patient states he was running back to the group home when he fell.  Landed on his wrist and knee.  No head injury.  No loss consciousness.  States he has a skin tear on his wrist.  Tdap is up-to-date.      Physical Exam   Triage Vital Signs: ED Triage Vitals  Encounter Vitals Group     BP 05/15/24 1336 (!) 130/108     Girls Systolic BP Percentile --      Girls Diastolic BP Percentile --      Boys Systolic BP Percentile --      Boys Diastolic BP Percentile --      Pulse Rate 05/15/24 1336 81     Resp 05/15/24 1336 16     Temp 05/15/24 1336 98.7 F (37.1 C)     Temp Source 05/15/24 1336 Oral     SpO2 05/15/24 1336 98 %     Weight 05/15/24 1330 167 lb (75.8 kg)     Height 05/15/24 1330 5' 8 (1.727 m)     Head Circumference --      Peak Flow --      Pain Score 05/15/24 1331 0     Pain Loc --      Pain Education --      Exclude from Growth Chart --     Most recent vital signs: Vitals:   05/15/24 1336 05/15/24 1508  BP: (!) 130/108 134/72  Pulse: 81 63  Resp: 16 16  Temp: 98.7 F (37.1 C)   SpO2: 98% 98%     General: Awake, no distress.   CV:  Good peripheral perfusion.  Resp:  Normal effort.  Abd:  No distention.   Other:  Right knee mildly tender, right wrist tender, skin avulsions noted on the right wrist, no active bleeding, neurovascular intact, patient is able to stand and   ED Results / Procedures / Treatments   Labs (all labs ordered are listed, but only abnormal results are displayed) Labs Reviewed - No data to display   EKG     RADIOLOGY X-ray of the right knee, right wrist    PROCEDURES:   Procedures  Critical Care:  no Chief  Complaint  Patient presents with   Fall      MEDICATIONS ORDERED IN ED: Medications - No data to display   IMPRESSION / MDM / ASSESSMENT AND PLAN / ED COURSE  I reviewed the triage vital signs and the nursing notes.                              Differential diagnosis includes, but is not limited to, fracture, contusion, strain, skin tear, laceration  Patient's presentation is most consistent with acute illness / injury with system symptoms.   X-ray of the right knee and right wrist were independently reviewed interpreted by me as being negative for any acute abnormality  Patient states Tdap is up-to-date so will not update his Tdap today.  I did clean the skin tears with normal saline.  Nursing staff instructed to use Vaseline gauze along with a dressing as there  is no skin to put back together.  I did explain the x-ray results to the patient.  Explained to him no fracture.  Follow-up with his doctor if not improving in 2 to 3 days.  Return emergency department if worsening.  I do not see any red flags to warrant imaging of the head and neck.  Patient is in agreement treatment plan.  He is discharged in stable condition.      FINAL CLINICAL IMPRESSION(S) / ED DIAGNOSES   Final diagnoses:  Fall, initial encounter  Skin avulsion  Sprain of right wrist, initial encounter  Contusion of right knee, initial encounter     Rx / DC Orders   ED Discharge Orders     None        Note:  This document was prepared using Dragon voice recognition software and may include unintentional dictation errors.    Gasper Devere ORN, PA-C 05/15/24 1553    Dorothyann Drivers, MD 05/16/24 1900

## 2024-05-15 NOTE — ED Notes (Signed)
 Pts R knee and R forearms irrigated with normal saline and dried with a clean towel. Vaseline guaze applied to both areas and wrapped with dry rolled gauze and secured with tape as instructed by PA Fisher.

## 2024-05-15 NOTE — ED Notes (Signed)
 Pts bed alarm is on, pt is wearing fall risk bracelet, and has his shoes on. Call bell is within reach

## 2024-05-15 NOTE — ED Notes (Signed)
 Omar Patterson was called three times and did not answer. Voicemail was left with this RN's name and call back phone number. Pts brother, Omar Patterson, was called twice and no one answered that phone number either.

## 2024-05-15 NOTE — ED Notes (Addendum)
 This RN spoke with Viviann Brought (group home rep) from [facility] group [facility] on [address]. Viviann Brought was told about the scans pt had done and their outcome and how his knee and forearm were dressed. Viviann Brought stated he would arrange transportation for pt to go back to group home and would call this RN back when someone is at [facility] for pt pickup.

## 2024-05-15 NOTE — ED Notes (Signed)
 Pt discharged with Alm from [facility] group [facility]

## 2024-05-15 NOTE — ED Notes (Signed)
 First nurse note:  To ED [facility] from [facility] ([address] for mechanical fall. Skin tear R knee and R hand. No LOC.   EMS VS: HR 82, 112/76, 98% RA

## 2024-05-26 ENCOUNTER — Other Ambulatory Visit: Payer: Self-pay

## 2024-05-26 ENCOUNTER — Emergency Department

## 2024-05-26 ENCOUNTER — Emergency Department
Admission: EM | Admit: 2024-05-26 | Discharge: 2024-05-26 | Disposition: A | Discharge status: Home / Self Care | Attending: Emergency Medicine | Admitting: Emergency Medicine

## 2024-05-26 DIAGNOSIS — R112 Nausea with vomiting, unspecified: Secondary | ICD-10-CM | POA: Insufficient documentation

## 2024-05-26 DIAGNOSIS — I700 Atherosclerosis of aorta: Secondary | ICD-10-CM | POA: Insufficient documentation

## 2024-05-26 DIAGNOSIS — K429 Umbilical hernia without obstruction or gangrene: Secondary | ICD-10-CM | POA: Diagnosis not present

## 2024-05-26 DIAGNOSIS — F259 Schizoaffective disorder, unspecified: Secondary | ICD-10-CM | POA: Insufficient documentation

## 2024-05-26 DIAGNOSIS — K760 Fatty (change of) liver, not elsewhere classified: Secondary | ICD-10-CM | POA: Diagnosis not present

## 2024-05-26 DIAGNOSIS — K4090 Unilateral inguinal hernia, without obstruction or gangrene, not specified as recurrent: Secondary | ICD-10-CM | POA: Diagnosis not present

## 2024-05-26 DIAGNOSIS — R4182 Altered mental status, unspecified: Secondary | ICD-10-CM | POA: Diagnosis present

## 2024-05-26 DIAGNOSIS — E871 Hypo-osmolality and hyponatremia: Secondary | ICD-10-CM | POA: Diagnosis not present

## 2024-05-26 DIAGNOSIS — D72829 Elevated white blood cell count, unspecified: Secondary | ICD-10-CM | POA: Insufficient documentation

## 2024-05-26 LAB — CBC
HCT: 41 % (ref 39.0–52.0)
Hemoglobin: 14 g/dL (ref 13.0–17.0)
MCH: 32.4 pg (ref 26.0–34.0)
MCHC: 34.1 g/dL (ref 30.0–36.0)
MCV: 94.9 fL (ref 80.0–100.0)
Platelets: 303 K/uL (ref 150–400)
RBC: 4.32 MIL/uL (ref 4.22–5.81)
RDW: 13.7 % (ref 11.5–15.5)
WBC: 11.2 K/uL — ABNORMAL HIGH (ref 4.0–10.5)
nRBC: 0 % (ref 0.0–0.2)

## 2024-05-26 LAB — LIPASE, BLOOD: Lipase: 12 U/L (ref 11–51)

## 2024-05-26 LAB — COMPREHENSIVE METABOLIC PANEL WITH GFR
ALT: 18 U/L (ref 0–44)
AST: 18 U/L (ref 15–41)
Albumin: 4.1 g/dL (ref 3.5–5.0)
Alkaline Phosphatase: 54 U/L (ref 38–126)
Anion gap: 11 (ref 5–15)
BUN: 11 mg/dL (ref 8–23)
CO2: 21 mmol/L — ABNORMAL LOW (ref 22–32)
Calcium: 10.6 mg/dL — ABNORMAL HIGH (ref 8.9–10.3)
Chloride: 102 mmol/L (ref 98–111)
Creatinine, Ser: 1.03 mg/dL (ref 0.61–1.24)
GFR, Estimated: >60 mL/min (ref >60)
Glucose, Bld: 109 mg/dL — ABNORMAL HIGH (ref 70–99)
Potassium: 3.9 mmol/L (ref 3.5–5.1)
Sodium: 134 mmol/L — ABNORMAL LOW (ref 135–145)
Total Bilirubin: 1.1 mg/dL (ref 0.0–1.2)
Total Protein: 7.2 g/dL (ref 6.5–8.1)

## 2024-05-26 MED ORDER — OMEPRAZOLE MAGNESIUM 20 MG PO TBEC
20.0000 mg | DELAYED_RELEASE_TABLET | Freq: Every day | ORAL | 0 refills | Status: DC
Start: 2024-05-26 — End: 2024-05-26

## 2024-05-26 MED ORDER — ONDANSETRON HCL 4 MG/2ML IJ SOLN
4.0000 mg | Freq: Once | INTRAMUSCULAR | Status: AC
  Administered 2024-05-26: 4 mg via INTRAVENOUS
  Filled 2024-05-26: qty 2

## 2024-05-26 MED ORDER — ONDANSETRON HCL 4 MG PO TABS: 4.0000 mg | ORAL_TABLET | Freq: Four times a day (QID) | ORAL | 0 refills | Status: AC | PRN

## 2024-05-26 MED ORDER — SUCRALFATE 1 G PO TABS
1.0000 g | ORAL_TABLET | Freq: Three times a day (TID) | ORAL | 0 refills | Status: AC | PRN
Start: 2024-05-26 — End: 2024-06-05

## 2024-05-26 MED ORDER — IOHEXOL 300 MG/ML  SOLN
100.0000 mL | Freq: Once | INTRAMUSCULAR | Status: AC | PRN
  Administered 2024-05-26: 100 mL via INTRAVENOUS

## 2024-05-26 NOTE — Discharge Instructions (Addendum)
 You were seen in the ER today for vomiting.  Your testing was fortunately overall reassuring.  It did show that you may have some irritation of your stomach lining called gastritis.  I sent a prescription for nausea medicine as well as a medicine called sucralfate.  Call your primary care doctor for further evaluation.  Return to the ER for new or worsening symptoms.

## 2024-05-26 NOTE — ED Provider Notes (Signed)
 [facility] Provider Note    Event Date/Time   First MD Initiated Contact with Patient 05/26/24 1418     (approximate)   History   Emesis   HPI  Omar Patterson is a 73 y.o. male past medical history significant for schizoaffective disorder who presents to the emergency department from group home with nausea and vomiting.  States that he has been having decreased oral intake and nausea and vomiting on a regular basis.  Recently treated with an antibiotic but uncertain of why or what it was for.  Patient without any complaints at this time.  Patient without any complaints.  Denies any abdominal pain.  Denies nausea or vomiting at this time.  No diarrhea or.  He is uncertain of his last bowel movement.     Physical Exam   Triage Vital Signs: ED Triage Vitals  Encounter Vitals Group     BP 05/26/24 1145 (!) 119/99     Girls Systolic BP Percentile --      Girls Diastolic BP Percentile --      Boys Systolic BP Percentile --      Boys Diastolic BP Percentile --      Pulse Rate 05/26/24 1145 75     Resp 05/26/24 1145 18     Temp 05/26/24 1145 98.7 F (37.1 C)     Temp src --      SpO2 05/26/24 1145 100 %     Weight 05/26/24 1147 165 lb 5.5 oz (75 kg)     Height 05/26/24 1147 5' 8 (1.727 m)     Head Circumference --      Peak Flow --      Pain Score --      Pain Loc --      Pain Education --      Exclude from Growth Chart --     Most recent vital signs: Vitals:   05/26/24 1430 05/26/24 1512  BP: 100/72 101/71  Pulse:  60  Resp:    Temp:  98.4 F (36.9 C)  SpO2:  100%    Physical Exam Constitutional:      Appearance: He is well-developed.  HENT:     Head: Atraumatic.  Eyes:     Conjunctiva/sclera: Conjunctivae normal.  Cardiovascular:     Rate and Rhythm: Regular rhythm.  Pulmonary:     Effort: No respiratory distress.  Abdominal:     Tenderness: There is no abdominal tenderness. There is no right CVA tenderness or left CVA  tenderness.  Musculoskeletal:     Cervical back: Normal range of motion.  Skin:    General: Skin is warm.     Capillary Refill: Capillary refill takes less than 2 seconds.  Neurological:     Mental Status: He is alert. Mental status is at baseline.  Psychiatric:        Mood and Affect: Mood normal.     IMPRESSION / MDM / ASSESSMENT AND PLAN / ED COURSE  I reviewed the triage vital signs and the nursing notes.  Differential diagnosis including electrolyte abnormality, dehydration, viral gastritis, urinary tract infection, constipation, malignancy  On chart review patient has frequent ER visits for various complaints.  Do not see where he was recently prescribed any antibiotics.   No tachycardic or bradycardic dysrhythmias while on cardiac telemetry.  RADIOLOGY CT scan abdomen and pelvis with contrast pending LABS (all labs ordered are listed, but only abnormal results are displayed) Labs interpreted as -  Labs Reviewed  COMPREHENSIVE METABOLIC PANEL WITH GFR - Abnormal; Notable for the following components:      Result Value   Sodium 134 (*)    CO2 21 (*)    Glucose, Bld 109 (*)    Calcium 10.6 (*)    All other components within normal limits  CBC - Abnormal; Notable for the following components:   WBC 11.2 (*)    All other components within normal limits  LIPASE, BLOOD  URINALYSIS, ROUTINE W REFLEX MICROSCOPIC     MDM  Lab work overall reassuring with no significant electrolyte abnormality.  Creatinine appears to be at baseline.  Lipase within normal limits.  Mild leukocytosis of 11.2.  Given IV fluids and antiemetics.  Patient is otherwise well-appearing.  Care transferred to incoming provider with CT scan pending.  If CT scan is reassuring plan to discharge home with outpatient follow-up with primary care physician.     PROCEDURES:  Critical Care performed: No  Procedures  Patient's presentation is most consistent with acute presentation with potential  threat to life or bodily function.   MEDICATIONS ORDERED IN ED: Medications  ondansetron  (ZOFRAN ) injection 4 mg (4 mg Intravenous Given 05/26/24 1509)    FINAL CLINICAL IMPRESSION(S) / ED DIAGNOSES   Final diagnoses:  Nausea and vomiting, unspecified vomiting type     Rx / DC Orders   ED Discharge Orders     None        Note:  This document was prepared using Dragon voice recognition software and may include unintentional dictation errors.   Suzanne Kirsch, MD 05/26/24 1544

## 2024-05-26 NOTE — ED Triage Notes (Signed)
 Pt to ED with [facility] for emesis for weeks. Reports was recently on antibiotics for same.  Pt pleasantly confused, caregiver poor historian

## 2024-05-26 NOTE — ED Provider Notes (Addendum)
 Care of this patient assumed from prior physician at 1500 pending CT and disposition. Please see prior physician note for further details.  Briefly, this is a 73 year old male who presented from his group home with recent nausea and vomiting.  Patient without complaints here.  Labs overall reassuring.  Signed out to me pending CT head and CT abdomen pelvis.  CT head without acute bleed CT abdomen pelvis without bowel obstruction or other acute abnormality.  Does note findings possibly reflective of gastritis.  Patient reassessed.  No new complaints or further episodes of vomiting.  Discussed CT findings.  Patient does report occasional episodes of reflux.  Is on omeprazole .  Will DC with prescription for sucralfate and Zofran .  Strict return precautions provided.  Patient discharged in stable condition.     Levander Slate, MD 05/26/24 [phone]

## 2024-06-01 ENCOUNTER — Emergency Department
Admission: EM | Admit: 2024-06-01 | Discharge: 2024-06-02 | Disposition: A | Discharge status: Home / Self Care | Attending: Emergency Medicine | Admitting: Emergency Medicine

## 2024-06-01 ENCOUNTER — Emergency Department

## 2024-06-01 ENCOUNTER — Other Ambulatory Visit: Payer: Self-pay

## 2024-06-01 DIAGNOSIS — W19XXXA Unspecified fall, initial encounter: Secondary | ICD-10-CM | POA: Diagnosis not present

## 2024-06-01 DIAGNOSIS — S0081XA Abrasion of other part of head, initial encounter: Secondary | ICD-10-CM | POA: Insufficient documentation

## 2024-06-01 DIAGNOSIS — S60511A Abrasion of right hand, initial encounter: Secondary | ICD-10-CM | POA: Diagnosis not present

## 2024-06-01 DIAGNOSIS — T148XXA Other injury of unspecified body region, initial encounter: Secondary | ICD-10-CM

## 2024-06-01 DIAGNOSIS — Y92129 Unspecified place in nursing home as the place of occurrence of the external cause: Secondary | ICD-10-CM | POA: Diagnosis not present

## 2024-06-01 DIAGNOSIS — S0990XA Unspecified injury of head, initial encounter: Secondary | ICD-10-CM | POA: Diagnosis present

## 2024-06-01 LAB — CBC WITH DIFFERENTIAL/PLATELET
Abs Immature Granulocytes: 0.02 K/uL (ref 0.00–0.07)
Basophils Absolute: 0.1 K/uL (ref 0.0–0.1)
Basophils Relative: 1 %
Eosinophils Absolute: 0.1 K/uL (ref 0.0–0.5)
Eosinophils Relative: 1 %
HCT: 36.6 % — ABNORMAL LOW (ref 39.0–52.0)
Hemoglobin: 12.9 g/dL — ABNORMAL LOW (ref 13.0–17.0)
Immature Granulocytes: 0 %
Lymphocytes Relative: 13 %
Lymphs Abs: 1.1 K/uL (ref 0.7–4.0)
MCH: 32.8 pg (ref 26.0–34.0)
MCHC: 35.2 g/dL (ref 30.0–36.0)
MCV: 93.1 fL (ref 80.0–100.0)
Monocytes Absolute: 0.5 K/uL (ref 0.1–1.0)
Monocytes Relative: 5 %
Neutro Abs: 7.2 K/uL (ref 1.7–7.7)
Neutrophils Relative %: 80 %
Platelets: 254 K/uL (ref 150–400)
RBC: 3.93 MIL/uL — ABNORMAL LOW (ref 4.22–5.81)
RDW: 13.7 % (ref 11.5–15.5)
WBC: 8.9 K/uL (ref 4.0–10.5)
nRBC: 0 % (ref 0.0–0.2)

## 2024-06-01 LAB — URINALYSIS, ROUTINE W REFLEX MICROSCOPIC
Bilirubin Urine: NEGATIVE
Glucose, UA: NEGATIVE mg/dL
Hgb urine dipstick: NEGATIVE
Ketones, ur: 5 mg/dL — AB
Leukocytes,Ua: NEGATIVE
Nitrite: NEGATIVE
Protein, ur: 30 mg/dL — AB
Specific Gravity, Urine: 1.011 (ref 1.005–1.030)
pH: 7 (ref 5.0–8.0)

## 2024-06-01 LAB — COMPREHENSIVE METABOLIC PANEL WITH GFR
ALT: 16 U/L (ref 0–44)
AST: 25 U/L (ref 15–41)
Albumin: 3.8 g/dL (ref 3.5–5.0)
Alkaline Phosphatase: 53 U/L (ref 38–126)
Anion gap: 12 (ref 5–15)
BUN: 10 mg/dL (ref 8–23)
CO2: 21 mmol/L — ABNORMAL LOW (ref 22–32)
Calcium: 10.2 mg/dL (ref 8.9–10.3)
Chloride: 101 mmol/L (ref 98–111)
Creatinine, Ser: 1.14 mg/dL (ref 0.61–1.24)
GFR, Estimated: >60 mL/min (ref >60)
Glucose, Bld: 131 mg/dL — ABNORMAL HIGH (ref 70–99)
Potassium: 3.7 mmol/L (ref 3.5–5.1)
Sodium: 134 mmol/L — ABNORMAL LOW (ref 135–145)
Total Bilirubin: 1 mg/dL (ref 0.0–1.2)
Total Protein: 6.5 g/dL (ref 6.5–8.1)

## 2024-06-01 LAB — TROPONIN I (HIGH SENSITIVITY): Troponin I (High Sensitivity): 4 ng/L (ref <18)

## 2024-06-01 LAB — LITHIUM LEVEL: Lithium Lvl: 0.28 mmol/L — ABNORMAL LOW (ref 0.60–1.20)

## 2024-06-01 MED ORDER — SODIUM CHLORIDE 0.9 % IV BOLUS
1000.0000 mL | Freq: Once | INTRAVENOUS | Status: AC
  Administered 2024-06-01: 1000 mL via INTRAVENOUS

## 2024-06-01 MED ORDER — ONDANSETRON HCL 4 MG/2ML IJ SOLN
4.0000 mg | Freq: Once | INTRAMUSCULAR | Status: AC
  Administered 2024-06-01: 4 mg via INTRAVENOUS
  Filled 2024-06-01: qty 2

## 2024-06-01 MED ORDER — ONDANSETRON 4 MG PO TBDP: 4.0000 mg | ORAL_TABLET | Freq: Three times a day (TID) | ORAL | 0 refills | Status: AC | PRN

## 2024-06-01 NOTE — ED Provider Notes (Signed)
 [facility] Provider Note    Event Date/Time   First MD Initiated Contact with Patient 06/01/24 1301     (approximate)   History   Fall   HPI  Omar Patterson is a 73 y.o. male  who presents to the emergency department today because of concern for fall. Patient is coming from living facility where he had an apparent witnessed fall. The patient states that his legs went out from under him. He is not complaining of any pain but he did suffer an abrasion to his forehead and right hand. Denies any recent illness.    Physical Exam   Triage Vital Signs: ED Triage Vitals  Encounter Vitals Group     BP 06/01/24 1238 107/68     Girls Systolic BP Percentile --      Girls Diastolic BP Percentile --      Boys Systolic BP Percentile --      Boys Diastolic BP Percentile --      Pulse Rate 06/01/24 1238 69     Resp --      Temp 06/01/24 1238 97.9 F (36.6 C)     Temp Source 06/01/24 1238 Oral     SpO2 06/01/24 1238 100 %     Weight 06/01/24 1239 146 lb 13.2 oz (66.6 kg)     Height 06/01/24 1239 5' 8 (1.727 m)     Head Circumference --      Peak Flow --      Pain Score 06/01/24 1239 0     Pain Loc --      Pain Education --      Exclude from Growth Chart --     Most recent vital signs: Vitals:   06/01/24 1238  BP: 107/68  Pulse: 69  Temp: 97.9 F (36.6 C)  SpO2: 100%   General: Awake, alert, not completely oriented to events. CV:  Good peripheral perfusion. Regular rate and rhythm. Resp:  Normal effort. Lungs clear. Abd:  No distention.  Skin:  Abrasion over the left forehead. Abrasion to right knuckles.    ED Results / Procedures / Treatments   Labs (all labs ordered are listed, but only abnormal results are displayed) Labs Reviewed  CBC WITH DIFFERENTIAL/PLATELET - Abnormal; Notable for the following components:      Result Value   RBC 3.93 (*)    Hemoglobin 12.9 (*)    HCT 36.6 (*)    All other components within normal limits   COMPREHENSIVE METABOLIC PANEL WITH GFR - Abnormal; Notable for the following components:   Sodium 134 (*)    CO2 21 (*)    Glucose, Bld 131 (*)    All other components within normal limits  URINALYSIS, ROUTINE W REFLEX MICROSCOPIC  TROPONIN I (HIGH SENSITIVITY)     EKG  I, Guadalupe Eagles, attending physician, personally viewed and interpreted this EKG  EKG Time: 1253 Rate: 73 Rhythm: sinus rhythm Axis: normal Intervals: qtc 408 QRS: narrow ST changes: no st elevation Impression: normal ekg    RADIOLOGY I independently interpreted and visualized the ct cervical spine. My interpretation: No fracture Radiology interpretation:  IMPRESSION:  1. Marked severity multilevel degenerative changes without evidence  of an acute fracture or subluxation.  2. Moderate to marked severity biapical scarring, atelectasis and/or  infiltrate.   I independently interpreted and visualized the CT head. My interpretation: No ICH Radiology interpretation:  IMPRESSION:  No acute intracranial pathology.   I independently interpreted and visualized the  right hand. My interpretation: No fracture Radiology interpretation:  IMPRESSION:  Negative.      PROCEDURES:  Critical Care performed: No   MEDICATIONS ORDERED IN ED: Medications - No data to display   IMPRESSION / MDM / ASSESSMENT AND PLAN / ED COURSE  I reviewed the triage vital signs and the nursing notes.                              Differential diagnosis includes, but is not limited to, ICH, fracture, dislocation, anemia, electrolyte abnormality, arrhythmia, ACS, infection  Patient's presentation is most consistent with acute presentation with potential threat to life or bodily function.   Patient presents today for a fall.  States that his legs went out from under him.  On exam patient is awake and alert although not completely oriented.  Does have abrasions to his right forehead and right knuckles.  CT scan of the head  and cervical spine without concerning abnormality.  Right hand x-ray without fractures.  Blood work without concerning anemia electrolyte abnormality or elevated troponin.  EKG without arrhythmia.  This time given concern for possible weakness secondary to UTI will wait UA.  Awaiting UA at time of signout.    FINAL CLINICAL IMPRESSION(S) / ED DIAGNOSES   Final diagnoses:  Fall, initial encounter  Abrasion        Note:  This document was prepared using Dragon voice recognition software and may include unintentional dictation errors.    Floy Roberts, MD 06/01/24 [phone]

## 2024-06-01 NOTE — ED Triage Notes (Addendum)
 Patient arrives from [facility] via [facility]. Patient had a witnessed fall, patient states he did not hit his head, and denies any pain while in triage. Patient has abrasions to the forehead as well as the right knuckle. EMS reported he was hypotensive on scene, and received 500 bolus of NS. The patient is not on any bloodthinners and denies loss of consciousness with fall. Patient states that he has not been able to keep food down for over a week.

## 2024-06-01 NOTE — Discharge Instructions (Addendum)
 Workup was reassuring I do suspect that he was dehydrated we have given him some fluids and some nausea medicines that seem to help with his walking.  It is important he stays well-hydrated and he can return to the ER for fevers, worsening symptoms or any other concern

## 2024-06-01 NOTE — ED Notes (Signed)
 Assisted with ambulation-- gait steady, no complaints. Mental status at baseliine; no complaints. To call facility and discuss plan for dc.

## 2024-06-01 NOTE — ED Notes (Signed)
 Updated charge nurse at facility [facility] regarding ED findings/results. That pt needs to cont with lithium , and keep up with neuro/Gi appointments.

## 2024-06-01 NOTE — ED Provider Notes (Signed)
 UA was negative.  There was some issues with patient being a little bit more weak the patient was given some fluid, Zofran .  The facility was called and they said that he just not been eating very well recently he has been a bit more weak.  Patient here does feel a little unsteady on his feet but his workup was reassuring without any evidence of intracranial hemorrhage cervical fracture, ACS, UTI.  No indication for admission.  After the fluids he is walking much better therefore we will discharge patient back to facility.   Ernest Ronal BRAVO, MD 06/01/24 [phone]

## 2024-06-02 DIAGNOSIS — S0081XA Abrasion of other part of head, initial encounter: Secondary | ICD-10-CM | POA: Diagnosis not present

## 2024-06-03 ENCOUNTER — Other Ambulatory Visit: Payer: Self-pay | Admitting: Otolaryngology

## 2024-06-03 DIAGNOSIS — R1310 Dysphagia, unspecified: Secondary | ICD-10-CM

## 2024-06-10 ENCOUNTER — Ambulatory Visit
Admission: RE | Admit: 2024-06-10 | Discharge: 2024-06-10 | Disposition: A | Discharge status: Home / Self Care | Source: Ambulatory Visit | Attending: Otolaryngology | Admitting: Otolaryngology

## 2024-06-10 DIAGNOSIS — R1310 Dysphagia, unspecified: Secondary | ICD-10-CM | POA: Diagnosis present

## 2024-06-10 NOTE — Therapy (Signed)
 Modified Barium Swallow Study  Patient Details  Name: Omar Patterson MRN: [id number] Date of Birth: Dec 22, 1950  Today's Date: 06/10/2024  Modified Barium Swallow completed.  Full report located under Chart Review in the Imaging Section.  History of Present Illness Pt is a 73 y.o. male who presents today for swallowing evaluation. Per chart review, pt with decreased oral intake, nausea and vomitting. Reported moderate difficulty swallowing pills, solids, and liquids during recent ENT visit; however, pt reports this has improved since starting medicine. PMHx schizoaffective disorder, ID,GERD.  CT c-spine, 06/01/24, 1. Marked severity multilevel degenerative changes without evidence   of an acute fracture or subluxation.   2. Moderate to marked severity biapical scarring, atelectasis and/or   infiltrate. Head CT, 06/01/24, No acute intracranial pathology.   Clinical Impression Pt seen for MBSS. Pt alert and cooperative; however, distractible and restless. Unable to fully assess due to pt positioning as pt with tendency to lean forward despite cueing. Pt demonstrated intact oropharyngeal swallow function. Recommend continuation of current diet with standard aspiration and reflux precautions. Consideration for further GI assessment if pt's complaints resume. No f/u ST warranted. Factors that may increase risk of adverse event in presence of aspiration Noe & Lianne 2021): Reduced cognitive function; GERD  Swallow Evaluation Recommendations Recommendations: PO diet PO Diet Recommendation: Regular;Thin liquids (Level 0) Liquid Administration via: Spoon;Cup;Straw Medication Administration: Whole meds with liquid Supervision: Patient able to self-feed (set up as needed for tremor) Swallowing strategies  : Slow rate;Small bites/sips Postural changes: Position pt fully upright for meals;Out of bed for meals (upright for >60 minutes after POs; follow MD instructions for PPI use) Oral care  recommendations: Oral care QID (4x/day) Recommended consults: Consider esophageal assessment;Consider GI consultation     Delon Bangs, M.S., CCC-SLP Speech-Language Pathologist [facility] [facility] [phone] (ASCOM)  Delon HERO Claretha Townshend 06/10/2024,1:29 PM
